# Patient Record
Sex: Male | Born: 1990 | Race: White | Hispanic: No | Marital: Single | Smoking: Former smoker
Health system: Southern US, Community
[De-identification: ages and names within clinical notes are randomized; demographics above are authoritative.]

## PROBLEM LIST (undated history)

## (undated) DIAGNOSIS — S060X9A Concussion with loss of consciousness of unspecified duration, initial encounter: Secondary | ICD-10-CM

## (undated) DIAGNOSIS — F909 Attention-deficit hyperactivity disorder, unspecified type: Secondary | ICD-10-CM

## (undated) HISTORY — DX: Concussion with loss of consciousness of unspecified duration, initial encounter: S06.0X9A

---
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
## 2012-03-08 ENCOUNTER — Other Ambulatory Visit: Payer: Self-pay | Admitting: Internal Medicine

## 2012-03-08 MED ORDER — AMPHETAMINE-DEXTROAMPHET ER 20 MG PO CP24: 20.0000 mg | ORAL_CAPSULE | Freq: Every day | ORAL | Status: DC | PRN

## 2012-03-08 NOTE — Telephone Encounter (Signed)
I spoke with patient's mother and she states Dr.Hopper rx'ed something (? ritlan and patient did not do well)  and then he seen a specialist( patient was given adderall- which he took infrequently). Patient will start school in 3 weeks and patient's mother states he will need something and she does not know which route to go.  Dr.Hopper please advise

## 2012-03-08 NOTE — Telephone Encounter (Signed)
Pt called stated he needs a refill of adderall, I do not see on current or past meds list Cb# [id number]

## 2012-03-08 NOTE — Telephone Encounter (Signed)
Spoke with patient's mother. RX to be picked up Monday

## 2012-03-08 NOTE — Telephone Encounter (Signed)
Adderall XR 20 mg dispense 30. 1 qd prn . Generic formulation if available

## 2012-03-27 ENCOUNTER — Telehealth: Payer: Self-pay | Admitting: *Deleted

## 2012-03-27 NOTE — Telephone Encounter (Signed)
Pt mom return call stating that Per pharmacy med needs PA . Pt mom states that  She will contact pharmacy to have them send over Info, will await fax from pharmacy with PA info.

## 2012-03-27 NOTE — Telephone Encounter (Signed)
Spoke with Pt mom will have Pt call to discuss med issue.

## 2012-03-27 NOTE — Telephone Encounter (Signed)
Pt mom left VM that Pt is having a issue filling the adderall. Pt mom indicated that pharmacy states that have faxed [place] 3 times already and have not gotten any response. Pt mom would like a call back to discuss. Pt mom is not on DPR left message for Pt to return call on home phone and VM on mom phone to return call to office to advise her that she is not on DPR so Pt will need to give permission to discuss med with her.

## 2012-03-27 NOTE — Telephone Encounter (Signed)
Pt return call,Left message to call office.  

## 2012-03-27 NOTE — Telephone Encounter (Signed)
Pt return call stating that he needed a refill on adderall advise Pt that his mom just call for Rx on 03-08-12 so he will need to check with her to verify that Rx was pick up.

## 2012-03-28 NOTE — Telephone Encounter (Signed)
Prior Auth approved 02-27-12 until 03-28-12, pharmacy notified, approval letter scan to chart

## 2012-04-22 ENCOUNTER — Other Ambulatory Visit: Payer: Self-pay | Admitting: *Deleted

## 2012-04-22 MED ORDER — AMPHETAMINE-DEXTROAMPHET ER 20 MG PO CP24: 20.0000 mg | ORAL_CAPSULE | Freq: Every day | ORAL | Status: DC | PRN

## 2012-04-22 NOTE — Telephone Encounter (Signed)
Left message to call office on Pt mom phone.

## 2012-04-23 ENCOUNTER — Telehealth: Payer: Self-pay | Admitting: Internal Medicine

## 2012-04-23 NOTE — Telephone Encounter (Signed)
Pt mom aware Rx ready for pick up. 

## 2012-04-23 NOTE — Telephone Encounter (Signed)
Opened in error

## 2012-05-27 ENCOUNTER — Other Ambulatory Visit: Payer: Self-pay

## 2012-05-27 MED ORDER — AMPHETAMINE-DEXTROAMPHET ER 20 MG PO CP24: 20.0000 mg | ORAL_CAPSULE | Freq: Every day | ORAL | Status: DC | PRN

## 2012-05-27 NOTE — Telephone Encounter (Signed)
OK X1 

## 2012-05-27 NOTE — Telephone Encounter (Signed)
Left message advising pt Rx available for pick up in our office before 5pm.    MW

## 2012-05-27 NOTE — Telephone Encounter (Signed)
Last OV 12/04/11. Last filled 04/22/12 #30 No refill. Plz advise

## 2012-05-27 NOTE — Telephone Encounter (Signed)
MOM LM ON TRIAGE line 1230pm wants to confirm message for adderall has been recv and when med refill will be ready for pick cb# [phone]

## 2012-06-28 ENCOUNTER — Other Ambulatory Visit: Payer: Self-pay

## 2012-06-28 MED ORDER — AMPHETAMINE-DEXTROAMPHET ER 20 MG PO CP24: 20.0000 mg | ORAL_CAPSULE | Freq: Every day | ORAL | Status: DC | PRN

## 2012-06-28 NOTE — Telephone Encounter (Signed)
OK #30 

## 2012-06-28 NOTE — Telephone Encounter (Signed)
Called LMOVM on house phone advising Rx ready for pick up.    MW

## 2012-06-28 NOTE — Telephone Encounter (Signed)
Henry Maldonado called requesting Rx for pt OV 12/04/11 last filled 05/27/12 # 30 no refills  Plz advise    MW

## 2012-07-08 ENCOUNTER — Ambulatory Visit (INDEPENDENT_AMBULATORY_CARE_PROVIDER_SITE_OTHER): Payer: 59 | Admitting: Family

## 2012-07-08 ENCOUNTER — Encounter: Payer: Self-pay | Admitting: Family

## 2012-07-08 VITALS — BP 140/100 | HR 75 | Temp 97.5°F | Resp 16 | Wt 159.1 lb

## 2012-07-08 DIAGNOSIS — N390 Urinary tract infection, site not specified: Secondary | ICD-10-CM | POA: Insufficient documentation

## 2012-07-08 DIAGNOSIS — R300 Dysuria: Secondary | ICD-10-CM

## 2012-07-08 LAB — POCT URINALYSIS DIPSTICK
Glucose, UA: NEGATIVE
Nitrite, UA: NEGATIVE
Urobilinogen, UA: 0.2

## 2012-07-08 MED ORDER — CIPROFLOXACIN HCL 500 MG PO TABS: 500.0000 mg | ORAL_TABLET | Freq: Two times a day (BID) | ORAL | Status: DC

## 2012-07-08 NOTE — Assessment & Plan Note (Signed)
Urine + leuks.  Plan rx with cipro.  Will send urine for culture, as well as GC/Chlamydia.
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
## 2012-07-08 NOTE — Patient Instructions (Signed)
Please call if symptom worsen or if no improvement in 2-3 days.

## 2012-07-09 LAB — GC/CHLAMYDIA PROBE AMP, URINE: Chlamydia, Swab/Urine, PCR: NEGATIVE

## 2012-07-09 LAB — URINE CULTURE
Colony Count: NO GROWTH
Organism ID, Bacteria: NO GROWTH

## 2012-07-10 ENCOUNTER — Telehealth: Payer: Self-pay | Admitting: Family

## 2012-07-10 NOTE — Telephone Encounter (Signed)
I would recommend that he purchase otc lotrimin cream and apply twice daily to affected area to see if this helps.

## 2012-07-10 NOTE — Telephone Encounter (Signed)
Reviewed lab work, left message on cell requesting return call.  When pt calls back please let him know that gonorrhea/chlamydia screen is negative.  Urine culture is negative.  OK to stop antibiotics if feeling better.

## 2012-07-10 NOTE — Telephone Encounter (Signed)
Notified pt and he voices understanding. He states that his symptoms are some improved but still has some itching.

## 2012-07-10 NOTE — Telephone Encounter (Signed)
Notified pt. 

## 2012-07-30 ENCOUNTER — Encounter: Payer: Self-pay | Admitting: Internal Medicine

## 2012-07-30 ENCOUNTER — Ambulatory Visit (INDEPENDENT_AMBULATORY_CARE_PROVIDER_SITE_OTHER): Payer: 59 | Admitting: Internal Medicine

## 2012-07-30 VITALS — BP 108/76 | HR 77 | Resp 12 | Ht 68.0 in | Wt 160.4 lb

## 2012-07-30 DIAGNOSIS — F909 Attention-deficit hyperactivity disorder, unspecified type: Secondary | ICD-10-CM

## 2012-07-30 MED ORDER — AMPHETAMINE-DEXTROAMPHETAMINE 10 MG PO TABS: ORAL_TABLET | ORAL | Status: DC

## 2012-07-30 MED ORDER — AMPHETAMINE-DEXTROAMPHET ER 20 MG PO CP24: 20.0000 mg | ORAL_CAPSULE | Freq: Every day | ORAL | Status: DC | PRN

## 2012-07-30 NOTE — Progress Notes (Signed)
  Subjective:    Patient ID: CALDER OBLINGER, male    DOB: 1991/05/10, 21 y.o.   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 With the medication his grades have been A's and B's in General Studies. It does seem to increase his anxiety some but otherwise there've been no adverse effects.  He was taking 2 pills a day if he had 4 classes per day.   Review of Systems  Constitutional: No significant change in weight,  sleep disruption, fatigue, weakness Cardiovascular: No palpitations, racing, irregularity, sweating Gastrointestinal: No nausea or vomiting,constipation, diarrhea Neuro: No tremor, mental status change,headache  Psych: No  depression, loss of interest, panic attacks,alcohol or drug abuse Endocrine: No change in skin/hair/nails      Objective:   Physical Exam   Gen.:  well-nourished; in no acute distress Eyes: Extraocular motion intact; no lid lag or proptosis Thyroid: normal w/o nodules Heart: Normal rhythm and rate without significant murmur, gallop, or extra heart sounds Lungs: Chest clear to auscultation without rales,rales, wheezes Neuro:Deep tendon reflexes are equal and within normal limits; fine tremor . Hyperactivity of legs Skin: Warm and dry without significant lesions or rashes; no onycholysis Psych: Normally communicative and interactive; no abnormal mood or affect clinically.         Assessment & Plan:

## 2012-07-30 NOTE — Assessment & Plan Note (Addendum)
The Adderall has been a valuable tool as documented by his success at [employer]. Minimal adverse effects such as some increase in anxiety.  Medication refilled ; short acting Adderall mid day if needed

## 2012-07-30 NOTE — Patient Instructions (Addendum)
Review and correct the record as indicated. Please share record with all medical staff seen.  

## 2012-07-31 ENCOUNTER — Ambulatory Visit: Payer: 59 | Admitting: Internal Medicine

## 2012-08-09 ENCOUNTER — Telehealth: Payer: Self-pay | Admitting: Internal Medicine

## 2012-08-09 NOTE — Telephone Encounter (Signed)
Opened in error. BC °

## 2012-08-30 ENCOUNTER — Other Ambulatory Visit: Payer: Self-pay

## 2012-08-30 DIAGNOSIS — F909 Attention-deficit hyperactivity disorder, unspecified type: Secondary | ICD-10-CM

## 2012-08-30 MED ORDER — AMPHETAMINE-DEXTROAMPHETAMINE 10 MG PO TABS: ORAL_TABLET | ORAL | Status: DC

## 2012-08-30 MED ORDER — AMPHETAMINE-DEXTROAMPHET ER 20 MG PO CP24: 20.0000 mg | ORAL_CAPSULE | Freq: Every day | ORAL | Status: DC | PRN

## 2012-08-30 NOTE — Telephone Encounter (Signed)
Spoke with patients mother and informed her rx's will be available for pick-up on Monday, patient will have to sign controlled substance contract. Patient's mother verbalized understanding.

## 2012-10-07 ENCOUNTER — Other Ambulatory Visit: Payer: Self-pay | Admitting: *Deleted

## 2012-10-07 DIAGNOSIS — F909 Attention-deficit hyperactivity disorder, unspecified type: Secondary | ICD-10-CM

## 2012-10-07 MED ORDER — AMPHETAMINE-DEXTROAMPHET ER 20 MG PO CP24: 20.0000 mg | ORAL_CAPSULE | Freq: Every day | ORAL | Status: DC | PRN

## 2012-10-07 MED ORDER — AMPHETAMINE-DEXTROAMPHETAMINE 10 MG PO TABS: ORAL_TABLET | ORAL | Status: DC

## 2012-10-07 NOTE — Telephone Encounter (Signed)
Pt mom aware Rx ready for pick up.

## 2012-11-12 ENCOUNTER — Other Ambulatory Visit: Payer: Self-pay | Admitting: *Deleted

## 2012-11-12 DIAGNOSIS — F909 Attention-deficit hyperactivity disorder, unspecified type: Secondary | ICD-10-CM

## 2012-11-12 MED ORDER — AMPHETAMINE-DEXTROAMPHETAMINE 10 MG PO TABS: ORAL_TABLET | ORAL | Status: DC

## 2012-11-12 MED ORDER — AMPHETAMINE-DEXTROAMPHET ER 20 MG PO CP24: 20.0000 mg | ORAL_CAPSULE | Freq: Every day | ORAL | Status: DC | PRN

## 2012-11-12 NOTE — Telephone Encounter (Signed)
Pt aware Rx will be ready in AM.

## 2012-12-16 ENCOUNTER — Telehealth: Payer: Self-pay | Admitting: *Deleted

## 2012-12-16 DIAGNOSIS — F909 Attention-deficit hyperactivity disorder, unspecified type: Secondary | ICD-10-CM

## 2012-12-16 NOTE — Telephone Encounter (Signed)
Last OV 07-30-12, last filled 11-12-12 #30

## 2012-12-16 NOTE — Telephone Encounter (Signed)
Very good note; well researched & explanatory Refill #30

## 2012-12-17 MED ORDER — AMPHETAMINE-DEXTROAMPHETAMINE 10 MG PO TABS: ORAL_TABLET | ORAL | Status: DC

## 2012-12-17 MED ORDER — AMPHETAMINE-DEXTROAMPHET ER 20 MG PO CP24: 20.0000 mg | ORAL_CAPSULE | Freq: Every day | ORAL | Status: DC | PRN

## 2012-12-17 NOTE — Telephone Encounter (Signed)
Left Pt detail VM Rx ready for pick up.

## 2012-12-17 NOTE — Telephone Encounter (Signed)
Rx placed up front for pick up. JLT

## 2013-01-14 ENCOUNTER — Telehealth: Payer: Self-pay | Admitting: Internal Medicine

## 2013-01-14 ENCOUNTER — Other Ambulatory Visit: Payer: 59

## 2013-01-14 DIAGNOSIS — Z202 Contact with and (suspected) exposure to infections with a predominantly sexual mode of transmission: Secondary | ICD-10-CM

## 2013-01-14 NOTE — Telephone Encounter (Signed)
Pt has already came in today to have labs done.

## 2013-01-14 NOTE — Telephone Encounter (Signed)
Hopp please give order for STD testing, or do you prefer patient schedule appointment with you ?

## 2013-01-14 NOTE — Telephone Encounter (Signed)
Patient called asking to come in for an STD test today. Says that something does not look or feel quite right. Scheduled patient for today at 3:15pm. Please put lab orders in for patient. Thanks.

## 2013-01-14 NOTE — Telephone Encounter (Signed)
HSV 2, VDRL, HIV; Code: STD exposure

## 2013-01-17 ENCOUNTER — Encounter: Payer: Self-pay | Admitting: *Deleted

## 2013-01-21 ENCOUNTER — Ambulatory Visit: Payer: 59 | Admitting: Internal Medicine

## 2013-01-21 VITALS — BP 124/78 | HR 119 | Wt 165.0 lb

## 2013-01-21 DIAGNOSIS — Z202 Contact with and (suspected) exposure to infections with a predominantly sexual mode of transmission: Secondary | ICD-10-CM

## 2013-01-21 DIAGNOSIS — Z9189 Other specified personal risk factors, not elsewhere classified: Secondary | ICD-10-CM

## 2013-01-21 DIAGNOSIS — F4323 Adjustment disorder with mixed anxiety and depressed mood: Secondary | ICD-10-CM

## 2013-01-21 MED ORDER — VILAZODONE HCL 10 & 20 & 40 MG PO KIT: 10.0000 mg | PACK | ORAL | Status: DC

## 2013-01-21 MED ORDER — VILAZODONE HCL 20 MG PO TABS
20.0000 mg | ORAL_TABLET | Freq: Every day | ORAL | Status: DC
Start: 2013-01-21 — End: 2013-01-21

## 2013-01-21 MED ORDER — FAMCICLOVIR 250 MG PO TABS: ORAL_TABLET | ORAL | Status: DC
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
## 2013-01-21 NOTE — Patient Instructions (Addendum)
Safe sex - if you may be exposed to STDs, use a condom. Monthly self genital exam recommended.  Psychiatry referral recommended if anxiety and panic are not controlled with the titration of Viibryd. I expressed that I was concerned about possible OCD or bipolar disorder which would require the expertise of a psychiatrist.

## 2013-01-28 ENCOUNTER — Ambulatory Visit: Payer: 59 | Admitting: Internal Medicine

## 2013-02-07 ENCOUNTER — Telehealth: Payer: Self-pay | Admitting: *Deleted

## 2013-02-07 DIAGNOSIS — F909 Attention-deficit hyperactivity disorder, unspecified type: Secondary | ICD-10-CM

## 2013-02-07 MED ORDER — AMPHETAMINE-DEXTROAMPHETAMINE 10 MG PO TABS: ORAL_TABLET | ORAL | Status: DC

## 2013-02-07 MED ORDER — AMPHETAMINE-DEXTROAMPHET ER 20 MG PO CP24: 20.0000 mg | ORAL_CAPSULE | Freq: Every day | ORAL | Status: DC | PRN

## 2013-02-07 NOTE — Telephone Encounter (Signed)
OK X1 

## 2013-02-07 NOTE — Telephone Encounter (Signed)
.  Last OV 01-21-13, Last refilled 12-17-12 #30

## 2013-02-07 NOTE — Telephone Encounter (Signed)
Refill done.  Pt made aware rx is ready to be picked up.

## 2013-03-06 ENCOUNTER — Encounter: Payer: Self-pay | Admitting: Lab

## 2013-03-07 ENCOUNTER — Ambulatory Visit: Payer: 59 | Admitting: Internal Medicine

## 2013-03-07 ENCOUNTER — Encounter: Payer: Self-pay | Admitting: Internal Medicine

## 2013-03-07 VITALS — BP 124/68 | HR 86 | Temp 97.9°F | Wt 171.0 lb

## 2013-03-07 DIAGNOSIS — L738 Other specified follicular disorders: Secondary | ICD-10-CM

## 2013-03-07 DIAGNOSIS — F909 Attention-deficit hyperactivity disorder, unspecified type: Secondary | ICD-10-CM

## 2013-03-07 DIAGNOSIS — L678 Other hair color and hair shaft abnormalities: Secondary | ICD-10-CM

## 2013-03-07 MED ORDER — AMPHETAMINE-DEXTROAMPHETAMINE 10 MG PO TABS: ORAL_TABLET | ORAL | Status: DC

## 2013-03-07 MED ORDER — HYDROXYZINE HCL 10 MG PO TABS: ORAL_TABLET | ORAL | Status: DC

## 2013-03-07 MED ORDER — AMPHETAMINE-DEXTROAMPHET ER 20 MG PO CP24: 20.0000 mg | ORAL_CAPSULE | Freq: Every day | ORAL | Status: DC | PRN

## 2013-03-07 MED ORDER — DOXYCYCLINE HYCLATE 100 MG PO TABS: ORAL_TABLET | ORAL | Status: DC

## 2013-03-07 NOTE — Progress Notes (Signed)
  Subjective:    Patient ID: Henry Maldonado, male    DOB: 1991-02-27, 22 y.o.   MRN: [id number]  HPI   He noted some penile lesions 3 days ago; these also had some lesions in his suprapubic area & inner thighs which which are pruritic. He's also had small red "marks" on upper extremities. Itching has resulted an eschar these areas. He denies unprotected sex, tick exposure or being in wooded area. No PMH of MRSA.  He took generic Famvir 252 pills twice a day without benefit.    Review of Systems  He denies fever, chills, sweats, weight loss  He has no dysuria, hematuria, or pyuria.  He's had no oral lesions or discharge from his eyes.     Objective:   Physical Exam  He is healthy and well-nourished in no distress  He has no lymphadenopathy about the neck, axilla, or inguinal areas  He has no organomegaly or masses  He has diffuse tiny, papular lesions with eschar mainly over the pubic area, penis,  & inner thighs. He has fewer lesions over the upper abdomen/lower thorax and upper extremities.        Assessment & Plan:  #1 folliculitis type lesions; herpetic infection is not suggested  Plan: See orders and recommendations

## 2013-03-07 NOTE — Patient Instructions (Addendum)
Clean  the affected with Hibiclens daily  as we discussed. If this fails to control the cellulitis; one capful Clorox to be placed in a gallon of water and mixed well. This can be used to cleanse the skin once daily.

## 2013-03-12 ENCOUNTER — Other Ambulatory Visit: Payer: Self-pay | Admitting: *Deleted

## 2013-03-12 DIAGNOSIS — R21 Rash and other nonspecific skin eruption: Secondary | ICD-10-CM

## 2013-03-12 MED ORDER — CHLORHEXIDINE GLUCONATE 4 % EX LIQD: 60.0000 mL | Freq: Every day | CUTANEOUS | Status: DC | PRN

## 2013-03-12 NOTE — Telephone Encounter (Signed)
Rx for hibiclens sent to [facility]

## 2013-03-19 ENCOUNTER — Telehealth: Payer: Self-pay | Admitting: *Deleted

## 2013-03-19 NOTE — Telephone Encounter (Signed)
Spoke with pt who states he was recently treated for small rash with abx and it has now spread to entire body. Patient stated that "it was itching so bad he was about to scratch his f____g skin off. I did ask the pt to refrain from using that type of language and made appt for him to be seen tomorrow as he was in [place] today and could not make it to the office today. Patient did apologize for language and thanked [place] for the prompt return call.

## 2013-03-20 ENCOUNTER — Ambulatory Visit: Payer: 59 | Admitting: Internal Medicine

## 2013-03-25 ENCOUNTER — Encounter: Payer: Self-pay | Admitting: Internal Medicine

## 2013-03-25 ENCOUNTER — Ambulatory Visit: Payer: 59 | Admitting: Internal Medicine

## 2013-03-25 VITALS — BP 122/88 | HR 89 | Temp 97.6°F | Wt 173.0 lb

## 2013-03-25 DIAGNOSIS — D690 Allergic purpura: Secondary | ICD-10-CM

## 2013-03-25 MED ORDER — HYDROXYZINE HCL 10 MG PO TABS: ORAL_TABLET | ORAL | Status: DC

## 2013-03-25 MED ORDER — PREDNISONE 20 MG PO TABS: 20.0000 mg | ORAL_TABLET | Freq: Two times a day (BID) | ORAL | Status: DC

## 2013-03-25 NOTE — Progress Notes (Signed)
  Subjective:    Patient ID: Henry Maldonado, male    DOB: 01-06-1991, 22 y.o.   MRN: [id number]  HPI   The skin lesions for which he was seen 7/25 did not respond to doxycycline. Hydroxyzine at bedtime did help the pruritus. He continues to have pruritic lesions diffusely.  He denies any exposure to vectors, pets, foods, medications, or contact allergens.  He has made major changes in his lifestyle. He plans to move to [place], [place] to attend school and get a job in that area. He has changed some of his social acquaintances  and feels very confident about his future.    Review of Systems  He has no extrinsic symptoms of itchy, watery eyes, sneezing. He's had no swelling of his lips or tongue or other signs of angioedema.  He denies fever, chills, sweats, weight loss.  He's had no oral lesions or blisters  He has no dysuria, hematuria, or pyuria  He denies any arthralgias or uveitis symptoms     Objective:   Physical Exam Gen.: Healthy and well-nourished in appearance. Alert, appropriate and cooperative throughout exam.  Eyes: No corneal or conjunctival inflammation noted. No icterus present  Ears: External  ear exam reveals no significant lesions or deformities. Canals clear .TMs normal. Hearing is grossly normal bilaterally. Nose: External nasal exam reveals no deformity or inflammation. Nasal mucosa are pink and moist. No lesions or exudates noted.  Mouth: Oral mucosa and oropharynx reveal no lesions or exudates. Teeth in good repair. Neck: No deformities, masses, or tenderness noted.  Thyroid normal. Lungs: Normal respiratory effort; chest expands symmetrically. Lungs are clear to auscultation without rales, wheezes, or increased work of breathing. Heart: Normal rate and rhythm. Normal S1 and S2. No gallop, click, or rub. S4 without murmur. Abdomen: Bowel sounds normal; abdomen soft and nontender. No masses, organomegaly or hernias noted. Genitalia: Genitalia  normal .                                Musculoskeletal/extremities: No clubbing, cyanosis, edema, or significant extremity  deformity noted.  Joints normal  Neurologic: Alert and oriented x3.  Skin: He has tiny punctate erythematous lesions which blanch and disappear with pressure. No dermatographia present. Lesions are most prominent over the medial thighs but also scattered over the thorax. Minimal lesions are noted on the posterior thorax. Lymph: No cervical, axillary, or inguinal lymphadenopathy present. Psych: Mood and affect are normal. Normally interactive . Very confident                                                                                    Assessment & Plan:  #1 tiny pruritic, erythematous papules diffusely which blanch with pressure suggesting a vasculitic response to ingested trigger(s).  Plan see orders & recommendations

## 2013-03-25 NOTE — Patient Instructions (Addendum)
Avoid colognes and cosmetics which are not hypoallergenic. Restrict hyperallergenic foods at this time: Nuts, strawberries, seafood , chocolate, and tomatoes.  Please bathe with moisturing liquid soaps, not bar soaps

## 2013-04-06 ENCOUNTER — Other Ambulatory Visit: Payer: Self-pay | Admitting: Internal Medicine

## 2013-04-07 ENCOUNTER — Telehealth: Payer: Self-pay | Admitting: General Practice

## 2013-04-07 DIAGNOSIS — F909 Attention-deficit hyperactivity disorder, unspecified type: Secondary | ICD-10-CM

## 2013-04-07 NOTE — Telephone Encounter (Signed)
Message left on triage line: Pt needs refills on both of his adderal medications. Pt states that he has recently moved to [place] for college. Wanted to have this [facility] [place]. [facility] informing pt that we cannot send the Rx to a pharmacy they must be picked up. However we could fill med for a couple months at a time. Waiting on pt to return call.

## 2013-04-08 ENCOUNTER — Telehealth: Payer: Self-pay | Admitting: General Practice

## 2013-04-09 NOTE — Telephone Encounter (Signed)
Rx sent to the pharmacy by e-script.//AB/CMA 

## 2013-04-10 NOTE — Telephone Encounter (Signed)
Please call mother, Thayer Ohm before 1 at [phone] tomorrow or father, Annette Stable after 1 tomorrow [phone] when prescription is ready to be picked up.  Please do for multiple months if possible.  Thanks

## 2013-04-11 MED ORDER — AMPHETAMINE-DEXTROAMPHETAMINE 10 MG PO TABS: ORAL_TABLET | ORAL | Status: DC

## 2013-04-11 MED ORDER — AMPHETAMINE-DEXTROAMPHET ER 20 MG PO CP24: 20.0000 mg | ORAL_CAPSULE | Freq: Every day | ORAL | Status: DC | PRN

## 2013-04-11 NOTE — Telephone Encounter (Signed)
Med filled. Mom notified.

## 2013-04-11 NOTE — Telephone Encounter (Signed)
Mom notified Rx is ready for pick up.

## 2013-04-11 NOTE — Telephone Encounter (Signed)
Could never get a hold of pt. However, pt mom stopped by office yesterday and stated it would be easiest for her getting to office if we could fill for 2 months at a time.   Last OV 03-25-2013 Both Adderall meds filled on 03-07-2013 #30 with 0 refills.

## 2013-04-11 NOTE — Telephone Encounter (Signed)
--   if he is not up to date on UDS we need to obtain one  -- RF each medicine one months supply -- as far as write Rx for 2 months, will leave that up to the PCP

## 2013-04-17 ENCOUNTER — Telehealth: Payer: Self-pay | Admitting: General Practice

## 2013-04-17 NOTE — Telephone Encounter (Signed)
Pt's mom just called the triage line stating that Pt needs a PA for his Adderall. Family uses [facility]. Pt is away at school for initial contact is mom.

## 2013-04-17 NOTE — Telephone Encounter (Signed)
Prior Berkley Harvey has been approved for patient Adderall medication.  A faxed copy will be scanned into epic patient will be notified.  Ag cma

## 2013-05-15 ENCOUNTER — Telehealth: Payer: Self-pay | Admitting: *Deleted

## 2013-05-15 DIAGNOSIS — F909 Attention-deficit hyperactivity disorder, unspecified type: Secondary | ICD-10-CM

## 2013-05-15 MED ORDER — AMPHETAMINE-DEXTROAMPHET ER 20 MG PO CP24: 20.0000 mg | ORAL_CAPSULE | Freq: Every day | ORAL | Status: DC | PRN

## 2013-05-15 NOTE — Telephone Encounter (Signed)
OK X1 

## 2013-05-15 NOTE — Telephone Encounter (Signed)
Med filled.  

## 2013-05-15 NOTE — Telephone Encounter (Signed)
Pt requesting refill on Adderall. Last seen on 03/25/2013, last filled on 04/11/2013 #30, 0 refills, no UDS on file, contract signed. Please advise. SW, CMA

## 2013-05-19 ENCOUNTER — Telehealth: Payer: Self-pay | Admitting: *Deleted

## 2013-05-19 DIAGNOSIS — F909 Attention-deficit hyperactivity disorder, unspecified type: Secondary | ICD-10-CM

## 2013-05-19 MED ORDER — AMPHETAMINE-DEXTROAMPHET ER 20 MG PO CP24: 20.0000 mg | ORAL_CAPSULE | Freq: Every day | ORAL | Status: DC | PRN

## 2013-05-19 NOTE — Telephone Encounter (Signed)
Pt is on Adderall 10mg   (Mid day prn only) and Adderall XR 20 (1/day)  04/11/2013 prescriptions done for 3 months for the 10mg   05/15/2013-prescription printed for 1 month for 20mg .  Can we do 20mg  for the month of November? Then patient will be due for 10mg  & 20mg  for December. Please advise. SW

## 2013-05-19 NOTE — Telephone Encounter (Signed)
Rx filled. Instructed not to fill until June 19, 2013. Pt the will be due for Adderall 10mg  & 20mg  for December per patient request. SW, CMA

## 2013-05-19 NOTE — Telephone Encounter (Signed)
Yes , post dated Rxs for both OK X 3 mos total

## 2013-06-26 ENCOUNTER — Telehealth: Payer: Self-pay | Admitting: *Deleted

## 2013-06-26 NOTE — Telephone Encounter (Addendum)
Patient is requesting refill for Adderall 10mg    Last visit on 03/25/2013  Last filled on 05/19/2013  UDS-not on file, contract on file.   Please advise. SW

## 2013-06-26 NOTE — Telephone Encounter (Signed)
Ok Rf #30 of each Needs contract and UDS at time of Rx pick up

## 2013-06-27 ENCOUNTER — Other Ambulatory Visit: Payer: Self-pay | Admitting: *Deleted

## 2013-06-27 DIAGNOSIS — F909 Attention-deficit hyperactivity disorder, unspecified type: Secondary | ICD-10-CM

## 2013-06-27 MED ORDER — AMPHETAMINE-DEXTROAMPHETAMINE 10 MG PO TABS: ORAL_TABLET | ORAL | Status: DC

## 2013-06-27 NOTE — Telephone Encounter (Signed)
Called and left message for patient that adderall script is ready for pick up and a urine sample is required as well.

## 2013-06-27 NOTE — Telephone Encounter (Signed)
Done. Pt aware 

## 2013-06-27 NOTE — Telephone Encounter (Signed)
Adderall 10 mg refilled 

## 2013-06-30 ENCOUNTER — Telehealth: Payer: Self-pay | Admitting: *Deleted

## 2013-06-30 NOTE — Telephone Encounter (Signed)
Refill his ADD med ; but if living in [place] he should be seeing MD there for additional refills . No UDS needed for this refill

## 2013-06-30 NOTE — Telephone Encounter (Signed)
Patient called and stated that he very upset that he has to come in and take a UDS when he just took one awhile. Looking further in the chart their is no record of it been done. Patient states that he lives in [place] and it is already bad enough that he has to drive here to pick up his prescription. Patient stated that we need to get it together and call him back. Left message on answering machine to return call to office.

## 2013-07-01 ENCOUNTER — Other Ambulatory Visit: Payer: Self-pay | Admitting: *Deleted

## 2013-07-01 NOTE — Telephone Encounter (Signed)
I called and left message for patient on 06/26/13 informing him that his adderall script is ready for pickup.

## 2013-08-20 ENCOUNTER — Telehealth: Payer: Self-pay | Admitting: *Deleted

## 2013-08-20 DIAGNOSIS — F909 Attention-deficit hyperactivity disorder, unspecified type: Secondary | ICD-10-CM

## 2013-08-20 MED ORDER — AMPHETAMINE-DEXTROAMPHETAMINE 10 MG PO TABS: ORAL_TABLET | ORAL | Status: DC

## 2013-08-20 MED ORDER — AMPHETAMINE-DEXTROAMPHET ER 20 MG PO CP24: 20.0000 mg | ORAL_CAPSULE | Freq: Every day | ORAL | Status: DC | PRN

## 2013-08-20 NOTE — Addendum Note (Signed)
Addended by: Baldwin JamaicaJOHNSON, Jessup Ogas G on: 08/20/2013 04:28 PM   Modules accepted: Orders

## 2013-08-20 NOTE — Telephone Encounter (Signed)
OK for both  I hope he is doing well. If he will be living  in [place] ; he needs to establish with PCP there as per [facility] requirements as monitor needed @ least every 6 mos

## 2013-08-20 NOTE — Telephone Encounter (Signed)
Patient is requesting a refill on Adderall 10mg  and 20mg . Last office visit 03/25/13 Last filled 06/19/13 (20mg ), 06/27/13 (10mg ) Agreement on file, low risk Okay to refill?

## 2013-08-20 NOTE — Telephone Encounter (Signed)
Rx (x2) for adderall printed and placed on ledge for signature.

## 2013-08-21 NOTE — Telephone Encounter (Signed)
LM @ (8:50am) informing the pt that his rx for Adderall is ready for pick-up and it will be put up front.//AB/CMA
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
## 2013-10-10 ENCOUNTER — Telehealth: Payer: Self-pay | Admitting: *Deleted

## 2013-10-10 NOTE — Telephone Encounter (Signed)
Patient left message on triage line requesting refill on Adderall. Attempted to call pt back left message on voice mail of cell phone that pt needs an office visit in order to obtain a refill. Advised in voice mail that Dr[facility], provided pt with office address and phone number so he may schedule appt.

## 2013-10-21 ENCOUNTER — Ambulatory Visit (INDEPENDENT_AMBULATORY_CARE_PROVIDER_SITE_OTHER): Payer: 59 | Admitting: Internal Medicine

## 2013-10-21 ENCOUNTER — Encounter: Payer: Self-pay | Admitting: Internal Medicine

## 2013-10-21 VITALS — BP 100/60 | HR 64 | Temp 98.0°F | Wt 171.0 lb

## 2013-10-21 DIAGNOSIS — B86 Scabies: Secondary | ICD-10-CM

## 2013-10-21 DIAGNOSIS — F909 Attention-deficit hyperactivity disorder, unspecified type: Secondary | ICD-10-CM

## 2013-10-21 MED ORDER — AMPHETAMINE-DEXTROAMPHET ER 20 MG PO CP24: 20.0000 mg | ORAL_CAPSULE | Freq: Every day | ORAL | Status: DC | PRN

## 2013-10-21 MED ORDER — PERMETHRIN 5 % EX CREA: 1.0000 "application " | TOPICAL_CREAM | Freq: Once | CUTANEOUS | Status: DC

## 2013-10-21 MED ORDER — AMPHETAMINE-DEXTROAMPHETAMINE 10 MG PO TABS: ORAL_TABLET | ORAL | Status: DC

## 2013-10-21 NOTE — Progress Notes (Signed)
   Subjective:    Patient ID: Henry Maldonado, male    DOB: 04-Dec-1990, 22 y.o.   MRN: [id number]  HPI   His girl friend was diagnosed by her physician as having scabies; he had similar physical signs of dermatologic involvement. She was prescribed a full course of generic Elimite; her doctor gave him a partial prescription & recommend followup here.  Apparently the vector originated in a bed belonging to a friend  in which they slept..  He has improved with the partial treatment but continues to have some itching of the anterior thighs.      Review of Systems  He has not had to use the ADD agents on a regular basis as he is now in Arts development officercomputer science  online program. This is an area in which he is very skilled & which holds his interested.  He has moved back from [place] to [place].     Objective:   Physical Exam General appearance is one of good health and nourishment w/o distress.  Eyes: No conjunctival inflammation or scleral icterus is present.  Heart:  Normal rate and regular rhythm. S1 and S2 normal without gallop, murmur, click, rub or other extra sounds     Lungs:Chest clear to auscultation; no wheezes, rhonchi,rales ,or rubs present.No increased work of breathing.   Skin:Warm & dry.  Intact without suspicious lesions or rashes ; no jaundice . No intertriginous or upper back lesions. Multiple tattoos.  Lymphatic: No lymphadenopathy is noted about the head, neck, axilla.   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

## 2013-10-22 NOTE — Patient Instructions (Signed)
Employ the generic Elimite as instructed. Laundry hygiene as discussed.

## 2013-11-20 ENCOUNTER — Telehealth: Payer: Self-pay | Admitting: Internal Medicine

## 2013-11-20 DIAGNOSIS — F909 Attention-deficit hyperactivity disorder, unspecified type: Secondary | ICD-10-CM

## 2013-11-20 NOTE — Telephone Encounter (Signed)
Requesting Adderall 20mg -Take 1 tablet by mouth daily as needed.and 10mg -Take 1 tablet by mouth mid day as needed only. Last refill:10-21-13:#30,0 Last OV:10-21-13 UDS:03-19-13 Please advise.//AB/CMA

## 2013-11-20 NOTE — Telephone Encounter (Signed)
Patient is requesting a refill on Adderall 10mg  and 20mg .  Please call when ready for pick-up.

## 2013-11-20 NOTE — Telephone Encounter (Signed)
OK 

## 2013-11-21 MED ORDER — AMPHETAMINE-DEXTROAMPHET ER 20 MG PO CP24: 20.0000 mg | ORAL_CAPSULE | Freq: Every day | ORAL | Status: DC | PRN

## 2013-11-21 MED ORDER — AMPHETAMINE-DEXTROAMPHETAMINE 10 MG PO TABS: ORAL_TABLET | ORAL | Status: DC

## 2013-11-21 NOTE — Telephone Encounter (Signed)
Rx printed and placed up front for the pt to pick-up.  LMOM(cell) @ (8:20am) informing the pt that his rx is ready and it will be placed up front for him to pick-up.//AB/CMA

## 2014-01-02 ENCOUNTER — Other Ambulatory Visit: Payer: Self-pay | Admitting: Internal Medicine

## 2014-01-02 ENCOUNTER — Telehealth: Payer: Self-pay | Admitting: *Deleted

## 2014-01-02 DIAGNOSIS — F909 Attention-deficit hyperactivity disorder, unspecified type: Secondary | ICD-10-CM

## 2014-01-02 MED ORDER — AMPHETAMINE-DEXTROAMPHETAMINE 10 MG PO TABS: ORAL_TABLET | ORAL | Status: DC

## 2014-01-02 MED ORDER — AMPHETAMINE-DEXTROAMPHET ER 20 MG PO CP24: 20.0000 mg | ORAL_CAPSULE | Freq: Every day | ORAL | Status: DC | PRN

## 2014-01-02 NOTE — Telephone Encounter (Signed)
Pt called requesting both dosages of Adderal to be refilled.  Please advise

## 2014-01-06 NOTE — Telephone Encounter (Signed)
Left message on VM Rx ready for pick up 

## 2014-01-29 ENCOUNTER — Other Ambulatory Visit: Payer: Self-pay | Admitting: Internal Medicine

## 2014-01-29 DIAGNOSIS — F909 Attention-deficit hyperactivity disorder, unspecified type: Secondary | ICD-10-CM

## 2014-01-29 MED ORDER — AMPHETAMINE-DEXTROAMPHETAMINE 10 MG PO TABS: ORAL_TABLET | ORAL | Status: DC

## 2014-01-29 MED ORDER — AMPHETAMINE-DEXTROAMPHET ER 20 MG PO CP24: 20.0000 mg | ORAL_CAPSULE | Freq: Every day | ORAL | Status: DC | PRN

## 2014-01-29 NOTE — Telephone Encounter (Signed)
Done hardcopy to robin  

## 2014-01-29 NOTE — Telephone Encounter (Signed)
Patient left message that he needs his Adderall refilled.

## 2014-01-29 NOTE — Telephone Encounter (Signed)
Called the patient informed Dr. Alwyn RenHopper is out of the office today and that Dr. Jonny RuizJohn did do his adderall refills, and hardcopy's are ready for pickup at the front desk.

## 2014-03-03 ENCOUNTER — Telehealth: Payer: Self-pay

## 2014-03-03 DIAGNOSIS — F909 Attention-deficit hyperactivity disorder, unspecified type: Secondary | ICD-10-CM

## 2014-03-03 NOTE — Telephone Encounter (Signed)
OK 1 month for both

## 2014-03-03 NOTE — Telephone Encounter (Signed)
Patient called [facility] requesting to pick up refill for adderall 10 and 20 mg on Friday. Thanks

## 2014-03-04 MED ORDER — AMPHETAMINE-DEXTROAMPHETAMINE 10 MG PO TABS: ORAL_TABLET | ORAL | Status: DC

## 2014-03-04 MED ORDER — AMPHETAMINE-DEXTROAMPHET ER 20 MG PO CP24: 20.0000 mg | ORAL_CAPSULE | Freq: Every day | ORAL | Status: DC | PRN

## 2014-03-04 NOTE — Telephone Encounter (Signed)
RX signed and placed upfront

## 2014-04-06 ENCOUNTER — Other Ambulatory Visit: Payer: Self-pay

## 2014-04-06 NOTE — Telephone Encounter (Signed)
Pt calling requesting refills on both adderall prescriptions, attempted to return call, but was unable to leave a message

## 2014-04-10 ENCOUNTER — Ambulatory Visit (INDEPENDENT_AMBULATORY_CARE_PROVIDER_SITE_OTHER): Payer: BLUE CROSS/BLUE SHIELD | Admitting: Internal Medicine

## 2014-04-10 ENCOUNTER — Encounter: Payer: Self-pay | Admitting: Internal Medicine

## 2014-04-10 VITALS — BP 112/82 | HR 67 | Temp 98.4°F | Wt 175.2 lb

## 2014-04-10 DIAGNOSIS — F909 Attention-deficit hyperactivity disorder, unspecified type: Secondary | ICD-10-CM

## 2014-04-10 MED ORDER — AMPHETAMINE-DEXTROAMPHETAMINE 10 MG PO TABS: ORAL_TABLET | ORAL | Status: DC

## 2014-04-10 MED ORDER — AMPHETAMINE-DEXTROAMPHET ER 20 MG PO CP24: 20.0000 mg | ORAL_CAPSULE | Freq: Every day | ORAL | Status: DC | PRN

## 2014-04-10 NOTE — Assessment & Plan Note (Signed)
meds renewed

## 2014-04-10 NOTE — Patient Instructions (Signed)
The ADD/ADHD medication can be written for one month with 2 postdated prescriptions.  Report any of the following: Weight gain / loss ,  sleep disruption, fatigue, weakness Heart racing or skipping, abnormal sweating Nausea or vomiting,loss of appetite,constipation, diarrhea Tremor, mental status change (hyperactive or lethargy),headache Anxiety, depression, loss of interest, panic attacks Change in skin/hair/nails

## 2014-04-10 NOTE — Progress Notes (Signed)
   Subjective:    Patient ID: Henry Maldonado, male    DOB: Aug 15, 1990, 23 y.o.   MRN: [id number]  HPI   He's been taking his Adderall extended release as well as a short acting form later  In the day as needed throughout the work week. Additionally he is on call every other weekend for emergencies and has taken meds on weekends for those reasons.  It is very effective with no adverse effects.  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      Review of Systems  Negative symptoms & signs: No weight gain / loss ,  sleep disruption, fatigue, weakness No heart racing or skipping, abnormal sweating No nausea or vomiting,loss of appetite,constipation, diarrhea No tremor, mental status change (hyperactive or lethargy),headache  No anxiety, depression, loss of interest, panic attacks, excess alcohol use No change in skin/hair/nails       Objective:   Physical Exam   Positive or pertinent findings include: Head shaven. He has well-healed scars of the forehead. There is dullness to percussion upper quadrant without organomegaly or masses.   Gen.:  well-nourished; in no acute distress Eyes: Extraocular motion intact; no lid lag or proptosis ,minimal unsustained vertical nystagmus Neck: full ROM; no masses ; thyroid normal  Heart: Normal rhythm and rate without significant murmur, gallop, or extra heart sounds Lungs: Chest clear to auscultation without rales,rales, wheezes Neuro:Deep tendon reflexes are equal and within normal limits; no tremor  Skin: Warm and dry without significant lesions or rashes; no onycholysis Lymphatic: no cervical or axillary LA Psych: Normally communicative and interactive; no abnormal mood or affect clinically.           Assessment & Plan:  See Current Assessment & Plan in Problem List under specific Diagnosis

## 2014-04-10 NOTE — Progress Notes (Signed)
Pre visit review using our clinic review tool, if applicable. No additional management support is needed unless otherwise documented below in the visit note. 

## 2014-07-01 ENCOUNTER — Ambulatory Visit: Payer: BC Managed Care – PPO | Admitting: Internal Medicine

## 2014-07-03 ENCOUNTER — Ambulatory Visit: Payer: BC Managed Care – PPO | Admitting: Internal Medicine

## 2014-07-06 ENCOUNTER — Ambulatory Visit: Payer: BC Managed Care – PPO | Admitting: Internal Medicine

## 2014-07-20 ENCOUNTER — Other Ambulatory Visit (INDEPENDENT_AMBULATORY_CARE_PROVIDER_SITE_OTHER): Payer: BLUE CROSS/BLUE SHIELD

## 2014-07-20 ENCOUNTER — Encounter: Payer: Self-pay | Admitting: Internal Medicine

## 2014-07-20 ENCOUNTER — Ambulatory Visit (INDEPENDENT_AMBULATORY_CARE_PROVIDER_SITE_OTHER): Payer: BLUE CROSS/BLUE SHIELD | Admitting: Internal Medicine

## 2014-07-20 VITALS — BP 122/78 | HR 124 | Temp 98.4°F | Wt 184.1 lb

## 2014-07-20 DIAGNOSIS — R000 Tachycardia, unspecified: Secondary | ICD-10-CM

## 2014-07-20 DIAGNOSIS — F908 Attention-deficit hyperactivity disorder, other type: Secondary | ICD-10-CM

## 2014-07-20 LAB — TSH: TSH: 2.19 u[IU]/mL (ref 0.35–4.50)

## 2014-07-20 LAB — T4, FREE: Free T4: 0.86 ng/dL (ref 0.60–1.60)

## 2014-07-20 LAB — T3, FREE: T3 FREE: 3.8 pg/mL (ref 2.3–4.2)

## 2014-07-20 MED ORDER — AMPHETAMINE-DEXTROAMPHETAMINE 10 MG PO TABS: ORAL_TABLET | ORAL | Status: DC

## 2014-07-20 MED ORDER — AMPHETAMINE-DEXTROAMPHET ER 20 MG PO CP24: 20.0000 mg | ORAL_CAPSULE | Freq: Every day | ORAL | Status: DC | PRN

## 2014-07-20 NOTE — Progress Notes (Signed)
   Subjective:    Patient ID: Henry Maldonado, male    DOB: [birth year], 23 y.o.   MRN: [id number]  HPI  He is here to follow-up his ADD; he takes Adderall extended release 20 mg 4-5 days week on average. He also takes the 10 mg 4- 5 days a week on average after lunch.  He uses these when he has a long work day.  He finds it very effective. He states these no longer fidgety and is able to focus on one task  He has no adverse effects from the medication  He has 1 cup of coffee a day but denies other stimulants. He does not smoke  His last thyroid function tests were in 2011; TSH was 2.5 and free T4 0.8.   Review of Systems   He specifically denies fever chills sweats, unexplained weight loss  He denies blurred vision, double vision, loss of vision  He has no chest pain, palpitations, dyspnea  He denies any adverse effect on sleep or appetite  He has no constipation or diarrhea  There is no associated anxiety or depression  He also denies any tremor. He denies any melena or rectal bleeding or other bleeding dyscrasias.     Objective:   Physical Exam  Initial pulse was 124; recheck was 102. Exam was otherwise unremarkable.  Gen.:  well-nourished; in no acute distress Eyes: Extraocular motion intact; no lid lag or proptosis ,nystagmus Neck: full ROM; no masses ; thyroid normal  Heart: Normal rhythm without significant murmur, gallop, or extra heart sounds Lungs: Chest clear to auscultation without rales,rales, wheezes Neuro:Deep tendon reflexes are equal and within normal limits; no tremor  Skin: Warm and dry without significant lesions or rashes; no onycholysis Lymphatic: no cervical or axillary LA Psych: Normally communicative and interactive; no abnormal mood or affect clinically. Very animated when discussing his job.         Assessment & Plan:  #1 ADD; effective response to medicines without adverse effects  #2 tachycardia  Plan :see orders &  recommendations

## 2014-07-20 NOTE — Patient Instructions (Signed)
To prevent fast heart beats, avoid stimulants such as decongestants, diet pills, nicotine, or caffeine (coffee, tea, cola, or chocolate) to excess.  Your next office appointment will be determined based upon review of your pending labs . Those instructions will be transmitted to you  by mail Followup as needed for your acute issue. Please report any significant change in your symptoms.

## 2014-07-20 NOTE — Progress Notes (Signed)
Pre visit review using our clinic review tool, if applicable. No additional management support is needed unless otherwise documented below in the visit note. 

## 2014-10-05 ENCOUNTER — Ambulatory Visit (INDEPENDENT_AMBULATORY_CARE_PROVIDER_SITE_OTHER): Payer: BLUE CROSS/BLUE SHIELD | Admitting: Internal Medicine

## 2014-10-05 ENCOUNTER — Encounter: Payer: Self-pay | Admitting: Internal Medicine

## 2014-10-05 VITALS — BP 134/86 | HR 87 | Temp 97.8°F | Ht 68.0 in | Wt 190.2 lb

## 2014-10-05 DIAGNOSIS — F908 Attention-deficit hyperactivity disorder, other type: Secondary | ICD-10-CM

## 2014-10-05 MED ORDER — AMPHETAMINE-DEXTROAMPHETAMINE 10 MG PO TABS: ORAL_TABLET | ORAL | Status: DC

## 2014-10-05 MED ORDER — AMPHETAMINE-DEXTROAMPHET ER 20 MG PO CP24: 20.0000 mg | ORAL_CAPSULE | Freq: Every day | ORAL | Status: DC | PRN

## 2014-10-05 NOTE — Progress Notes (Signed)
Pre visit review using our clinic review tool, if applicable. No additional management support is needed unless otherwise documented below in the visit note. 

## 2014-10-05 NOTE — Progress Notes (Signed)
   Subjective:    Patient ID: Henry Maldonado, male    DOB: July 12, 1991, 24 y.o.   MRN: [id number]  HPI  He has been compliant with his ADD medication without adverse effect.  His work schedule is markedly variable. During long days he will take 20 mg followed by 10 mg later in the day of the Adderall. If he starts work late in the day he will only take the 20 mg pill. He does work on the weekends intermittently.  He will be in [place] for at least 2 months and is requesting a longer refill on the medications.  Review of Systems  Negative symptoms & signs: No weight loss ,  sleep disruption, fatigue, weakness. Weight actually up some because he must eat fast food when out of town. No heart racing or skipping, abnormal sweating No nausea or vomiting,loss of appetite,constipation, diarrhea No tremor, mental status change (hyperactive or lethargy),headache  No anxiety, depression, loss of interest, panic attacks, excess alcohol use No change in skin/hair/nails      Objective:   Physical Exam   Gen.:  Adequately nourished; in no acute distress Eyes: Extraocular motion intact; no lid lag , proptosis , or nystagmus Neck: full ROM; no masses ; thyroid normal  Heart: Normal rhythm and rate without significant murmur, gallop, or extra heart sounds Lungs: Chest clear to auscultation without rales,rales, wheezes Neuro:Deep tendon reflexes are equal and within normal limits; no tremor  Skin/Nails: Warm and dry without significant lesions or rashes; no onycholysis Lymphatic: no cervical or axillary LA Psych: Normally communicative and interactive; no abnormal mood or affect clinically.       Assessment & Plan:  #1 ADD; excellent response to medication without adverse effects new  Plan: 3 months of medication will be prescribed because of the logistics of his work schedule

## 2014-10-06 NOTE — Patient Instructions (Signed)
As we discussed; please safeguard the refill prescriptions.Unfortunately if they are lost; they can not be replaced as this is a controlled substance.This is the [facility] [employer] of Medicine standard.

## 2015-01-19 ENCOUNTER — Other Ambulatory Visit: Payer: Self-pay

## 2015-01-19 ENCOUNTER — Telehealth: Payer: Self-pay | Admitting: Internal Medicine

## 2015-01-19 DIAGNOSIS — F908 Attention-deficit hyperactivity disorder, other type: Secondary | ICD-10-CM

## 2015-01-19 MED ORDER — AMPHETAMINE-DEXTROAMPHET ER 20 MG PO CP24: 20.0000 mg | ORAL_CAPSULE | Freq: Every day | ORAL | Status: DC | PRN

## 2015-01-19 MED ORDER — AMPHETAMINE-DEXTROAMPHETAMINE 10 MG PO TABS
ORAL_TABLET | ORAL | Status: DC
Start: 2015-01-19 — End: 2015-01-26

## 2015-01-19 NOTE — Telephone Encounter (Signed)
Please advise, thanks.

## 2015-01-19 NOTE — Telephone Encounter (Signed)
Left message advising patient that both adderall rx are ready for pick up at office---BUT Patient MUST make office visit appointment before he leaves with these prescriptions

## 2015-01-19 NOTE — Telephone Encounter (Signed)
Pt called in and said that he is traveling a lot and wanted to know if hop can refill both his Addrells.  He said at least a month until he can get in here for a fu.  He said he can stop by an pick it up?

## 2015-01-19 NOTE — Telephone Encounter (Signed)
OK if on schedule

## 2015-01-26 ENCOUNTER — Encounter: Payer: Self-pay | Admitting: Internal Medicine

## 2015-01-26 ENCOUNTER — Ambulatory Visit (INDEPENDENT_AMBULATORY_CARE_PROVIDER_SITE_OTHER): Payer: BLUE CROSS/BLUE SHIELD | Admitting: Internal Medicine

## 2015-01-26 VITALS — BP 120/85 | HR 66 | Temp 97.9°F | Resp 20 | Wt 181.0 lb

## 2015-01-26 DIAGNOSIS — F908 Attention-deficit hyperactivity disorder, other type: Secondary | ICD-10-CM | POA: Diagnosis not present

## 2015-01-26 MED ORDER — AMPHETAMINE-DEXTROAMPHETAMINE 10 MG PO TABS: ORAL_TABLET | ORAL | Status: DC

## 2015-01-26 MED ORDER — AMPHETAMINE-DEXTROAMPHET ER 20 MG PO CP24: 20.0000 mg | ORAL_CAPSULE | Freq: Every day | ORAL | Status: DC | PRN

## 2015-01-26 NOTE — Progress Notes (Signed)
Pre visit review using our clinic review tool, if applicable. No additional management support is needed unless otherwise documented below in the visit note. 

## 2015-01-26 NOTE — Assessment & Plan Note (Signed)
01/26/2015: Excellent response to medication without adverse effects. Medication should be renewed for 6 months without need for follow-up.

## 2015-01-26 NOTE — Patient Instructions (Addendum)
Please report any adverse effects with your medications.  Your blood pressure goal is an average less than 135/85. It can be checked at the pharmacy; please keep a diary of readings.

## 2015-01-26 NOTE — Progress Notes (Signed)
   Subjective:    Patient ID: Henry Maldonado, male    DOB: Jul 05, 1991, 24 y.o.   MRN: [id number]  HPI  His medications for his ADD are very effective with no adverse effects.  His blood pressure initially was 160/90. He has no history of hypertension. He only got 3 hours of sleep in the last 24 hours due to job requirements. He also drank an energy drink to stay awake. This was an aberration; he does not ingest stimulants on regular basis.   Review of Systems  Negative symptoms & signs: No weight gain / loss ,  sleep disruption, fatigue, weakness No heart racing or skipping, abnormal sweating No nausea or vomiting,loss of appetite,constipation, diarrhea No tremor, mental status change (hyperactive or lethargy),headache  No anxiety, depression, loss of interest, panic attacks, excess alcohol use No change in skin/hair/nails       Objective:   Physical Exam  Appears healthy and well-nourished & in no acute distress  No carotid bruits are present.Thyroid normal to palpation  Heart rhythm and rate are normal with no gallop or murmur  Chest is clear with no increased work of breathing  There is no evidence of aortic aneurysm or renal artery bruits  Abdomen soft with no organomegaly or masses. No HJR  No clubbing, cyanosis or edema present.  Pedal pulses are intact   No ischemic skin changes are present . Fingernails/ toenails healthy   Alert and oriented. Strength, tone, DTRs reflexes normal  Skin: scattered tatooes         Assessment & Plan:  See Current Assessment & Plan in Problem List under specific Diagnosis

## 2015-02-16 ENCOUNTER — Telehealth: Payer: Self-pay | Admitting: Internal Medicine

## 2015-02-16 NOTE — Telephone Encounter (Signed)
Patient stated that he need a refill of his Adderall will like to know if he can pick it up this morning. Please advise

## 2015-02-16 NOTE — Telephone Encounter (Signed)
LVM stating refill is ready for pick up at front desk and must schedule an office visit before leaving.

## 2015-02-16 NOTE — Telephone Encounter (Signed)
Patient called regarding his adderall rx. He states the paper rx he picked up today is dated for 01/19/15. He is leaving to go out of town for an extended period of time and would like a call back ASAP. CB# [phone]

## 2015-03-05 ENCOUNTER — Encounter: Payer: Self-pay | Admitting: Internal Medicine

## 2015-03-05 ENCOUNTER — Ambulatory Visit (INDEPENDENT_AMBULATORY_CARE_PROVIDER_SITE_OTHER): Payer: BLUE CROSS/BLUE SHIELD | Admitting: Internal Medicine

## 2015-03-05 VITALS — BP 126/78 | HR 95 | Temp 97.8°F | Resp 16 | Wt 178.0 lb

## 2015-03-05 DIAGNOSIS — F908 Attention-deficit hyperactivity disorder, other type: Secondary | ICD-10-CM

## 2015-03-05 MED ORDER — AMPHETAMINE-DEXTROAMPHET ER 20 MG PO CP24: 20.0000 mg | ORAL_CAPSULE | Freq: Every day | ORAL | Status: DC | PRN

## 2015-03-05 MED ORDER — AMPHETAMINE-DEXTROAMPHETAMINE 10 MG PO TABS
ORAL_TABLET | ORAL | Status: DC
Start: 2015-03-05 — End: 2015-05-31

## 2015-03-05 MED ORDER — AMPHETAMINE-DEXTROAMPHETAMINE 10 MG PO TABS
ORAL_TABLET | ORAL | Status: DC
Start: 2015-03-05 — End: 2015-03-05

## 2015-03-05 MED ORDER — AMPHETAMINE-DEXTROAMPHETAMINE 10 MG PO TABS: ORAL_TABLET | ORAL | Status: DC

## 2015-03-05 NOTE — Progress Notes (Signed)
Pre visit review using our clinic review tool, if applicable. No additional management support is needed unless otherwise documented below in the visit note. 

## 2015-03-05 NOTE — Progress Notes (Signed)
   Subjective:    Patient ID: Henry Maldonado, male    DOB: 11-Nov-1990, 24 y.o.   MRN: [id number]  HPI  He has been compliant with his medications without adverse effects. 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 He is in [facility] at this time ;he returns here for 4-5 day stretches intermittently  He has received good reviews his work.  He is under some stress as he and his girlfriend are having issues he feels he is handling this adequately.   Review of Systems  Negative symptoms & signs: No weight gain / loss ,  sleep disruption, fatigue, weakness No heart racing or skipping, abnormal sweating No nausea or vomiting,loss of appetite,constipation, diarrhea No tremor, mental status change (hyperactive or lethargy),headache  No anxiety, depression, loss of interest, panic attacks, excess alcohol use No change in skin/hair/nails     Objective:   Physical Exam   Gen.:  Adequately nourished; in no acute distress Eyes: Extraocular motion intact; no lid lag , proptosis , or nystagmus Neck: full ROM; no masses ; thyroid normal  Heart: Normal rhythm and rate without significant murmur, gallop, or extra heart sounds Lungs: Chest clear to auscultation without rales,rales, wheezes Neuro:Deep tendon reflexes are equal and within normal limits; no tremor  Skin/Nails: Warm and dry without significant lesions or rashes; no onycholysis Lymphatic: no cervical or axillary LA Psych: Normally communicative and interactive; no abnormal mood or affect clinically.          Assessment & Plan:   #1 ADHD, excellent response to medications  Plan: Medications will be refilled 3 months. He should be reevaluated in 6 months.

## 2015-03-05 NOTE — Patient Instructions (Signed)
Medications will be refilled for 90 days. Unfortunately if the prescriptions or lost, they cannot be replaced.

## 2015-03-08 ENCOUNTER — Ambulatory Visit: Payer: BLUE CROSS/BLUE SHIELD | Admitting: Internal Medicine

## 2015-04-28 ENCOUNTER — Telehealth: Payer: Self-pay | Admitting: Internal Medicine

## 2015-04-28 ENCOUNTER — Encounter: Payer: Self-pay | Admitting: Internal Medicine

## 2015-04-28 NOTE — Telephone Encounter (Signed)
  See letter; he'll need to pick it up tomorrow

## 2015-04-28 NOTE — Telephone Encounter (Signed)
Please advise 

## 2015-04-28 NOTE — Telephone Encounter (Signed)
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     Best number [phone]

## 2015-04-29 NOTE — Telephone Encounter (Signed)
Spoke with pt 04/28/15, stated he would be in on 04/29/15 to pick up letter

## 2015-05-31 ENCOUNTER — Telehealth: Payer: Self-pay | Admitting: *Deleted

## 2015-05-31 DIAGNOSIS — F908 Attention-deficit hyperactivity disorder, other type: Secondary | ICD-10-CM

## 2015-05-31 MED ORDER — AMPHETAMINE-DEXTROAMPHET ER 20 MG PO CP24: 20.0000 mg | ORAL_CAPSULE | Freq: Every day | ORAL | Status: DC | PRN

## 2015-05-31 MED ORDER — AMPHETAMINE-DEXTROAMPHETAMINE 10 MG PO TABS: ORAL_TABLET | ORAL | Status: DC

## 2015-05-31 NOTE — Telephone Encounter (Signed)
Left msg on triage requesting rx for Adderral.../lmb

## 2015-05-31 NOTE — Telephone Encounter (Signed)
OK if not premature; I thought he got 3 post dated Rxs @ last visit

## 2015-05-31 NOTE — Telephone Encounter (Signed)
Correct which he is due for refill on the 06/05/15.PRINTED RX'S NOTIFIED PT READY FOR PICK-UP...Raechel Chute/LMB

## 2015-07-03 ENCOUNTER — Emergency Department (HOSPITAL_COMMUNITY)
Admission: EM | Admit: 2015-07-03 | Discharge: 2015-07-03 | Disposition: A | Discharge status: Home / Self Care | Payer: BLUE CROSS/BLUE SHIELD | Source: Home / Self Care | Attending: Emergency Medicine | Admitting: Emergency Medicine

## 2015-07-03 ENCOUNTER — Encounter (HOSPITAL_COMMUNITY): Payer: Self-pay | Admitting: *Deleted

## 2015-07-03 DIAGNOSIS — R1033 Periumbilical pain: Secondary | ICD-10-CM | POA: Diagnosis not present

## 2015-07-03 NOTE — ED Notes (Signed)
Upon entering room to review discharge pt, pt no longer in room.  D/C instructions had been reviewed w/ pt verbally per D. Mabe, NP.

## 2015-07-03 NOTE — Discharge Instructions (Signed)
Abdominal Pain, Adult He may have had some gastritis as a result of your alcohol intake last night. The location of your pain that occurred this morning is primarily over the middle of the gut/intestines. It is likely a had some cramping of the colon associated with gas. There is also some suspicion of retained stool and therefore he may have some constipation. No heavy meals today. Primarily liquids and no alcohol. Is not having good bowel movements by tomorrow consider using MiraLAX as we discussed. For any worsening, new symptoms or problems, persistent pain, fever, vomiting recommend seeking medical attention promptly such as in the emergency department. Many things can cause abdominal pain. Usually, abdominal pain is not caused by a disease and will improve without treatment. It can often be observed and treated at home. Your health care provider will do a physical exam and possibly order blood tests and X-rays to help determine the seriousness of your pain. However, in many cases, more time must pass before a clear cause of the pain can be found. Before that point, your health care provider may not know if you need more testing or further treatment. HOME CARE INSTRUCTIONS Monitor your abdominal pain for any changes. The following actions may help to alleviate any discomfort you are experiencing:  Only take over-the-counter or prescription medicines as directed by your health care provider.  Do not take laxatives unless directed to do so by your health care provider.  Try a clear liquid diet (broth, tea, or water) as directed by your health care provider. Slowly move to a bland diet as tolerated. SEEK MEDICAL CARE IF:  You have unexplained abdominal pain.  You have abdominal pain associated with nausea or diarrhea.  You have pain when you urinate or have a bowel movement.  You experience abdominal pain that wakes you in the night.  You have abdominal pain that is worsened or improved by  eating food.  You have abdominal pain that is worsened with eating fatty foods.  You have a fever. SEEK IMMEDIATE MEDICAL CARE IF:  Your pain does not go away within 2 hours.  You keep throwing up (vomiting).  Your pain is felt only in portions of the abdomen, such as the right side or the left lower portion of the abdomen.  You pass bloody or black tarry stools. MAKE SURE YOU:  Understand these instructions.  Will watch your condition.  Will get help right away if you are not doing well or get worse.   This information is not intended to replace advice given to you by your health care provider. Make sure you discuss any questions you have with your health care provider.   Document Released: 05/10/2005 Document Revised: 04/21/2015 Document Reviewed: 04/09/2013 Elsevier Interactive Patient Education [employer]2016 [employer] Inc[employer]
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
## 2015-07-03 NOTE — ED Notes (Signed)
C/O intermittent, severe, sharp abd pains in periumbilical and epigastric area.  No pain at present.  States he did drink a lot of alcohol last night.  Has had slight diarrhea and slight nausea, but  Pt feels those sxs are normal based on the amount of alcohol he drank last night.  Has taken Pepto.

## 2015-07-14 ENCOUNTER — Ambulatory Visit (INDEPENDENT_AMBULATORY_CARE_PROVIDER_SITE_OTHER): Payer: BLUE CROSS/BLUE SHIELD | Admitting: Family

## 2015-07-14 ENCOUNTER — Encounter: Payer: Self-pay | Admitting: Family

## 2015-07-14 VITALS — BP 134/88 | HR 81 | Temp 98.0°F | Resp 18 | Ht 68.0 in | Wt 187.0 lb

## 2015-07-14 DIAGNOSIS — F908 Attention-deficit hyperactivity disorder, other type: Secondary | ICD-10-CM

## 2015-07-14 MED ORDER — AMPHETAMINE-DEXTROAMPHET ER 20 MG PO CP24: 20.0000 mg | ORAL_CAPSULE | Freq: Every day | ORAL | Status: DC

## 2015-07-14 MED ORDER — AMPHETAMINE-DEXTROAMPHETAMINE 10 MG PO TABS: 10.0000 mg | ORAL_TABLET | Freq: Every day | ORAL | Status: DC | PRN

## 2015-07-14 NOTE — Patient Instructions (Addendum)
Thank you for choosing [facility].  Summary/Instructions:  Your prescription(s) have been submitted to your pharmacy or been printed and provided for you. Please take as directed and contact our office if you believe you are having problem(s) with the medication(s) or have any questions.  Continue to take your medications as prescribed.  
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
## 2015-07-14 NOTE — Assessment & Plan Note (Signed)
ADHD is well controlled with current regimen of Adderall and denies adverse side effects. Continue current dosage of Adderall. [place] controlled substance database reviewed with no irregularities. Follow-up in 3 months.

## 2015-07-14 NOTE — Progress Notes (Signed)
Pre visit review using our clinic review tool, if applicable. No additional management support is needed unless otherwise documented below in the visit note. 

## 2015-08-19 ENCOUNTER — Telehealth: Payer: Self-pay | Admitting: Family

## 2015-08-19 DIAGNOSIS — F908 Attention-deficit hyperactivity disorder, other type: Secondary | ICD-10-CM

## 2015-08-19 MED ORDER — AMPHETAMINE-DEXTROAMPHETAMINE 10 MG PO TABS: 10.0000 mg | ORAL_TABLET | Freq: Every day | ORAL | Status: DC | PRN

## 2015-08-19 MED ORDER — AMPHETAMINE-DEXTROAMPHET ER 20 MG PO CP24: 20.0000 mg | ORAL_CAPSULE | Freq: Every day | ORAL | Status: DC

## 2015-08-19 NOTE — Telephone Encounter (Signed)
Medication refilled

## 2015-08-19 NOTE — Telephone Encounter (Signed)
Called and advised.

## 2015-08-19 NOTE — Telephone Encounter (Signed)
Patient is reqeusting a refill of   amphetamine-dextroamphetamine (ADDERALL) 10 MG tablet [[id number]][[id number]]      And amphetamine-dextroamphetamine (ADDERALL XR) 20 MG 24 hr capsule [[id number]][[id number]]

## 2015-09-20 ENCOUNTER — Telehealth: Payer: Self-pay | Admitting: *Deleted

## 2015-09-20 DIAGNOSIS — F908 Attention-deficit hyperactivity disorder, other type: Secondary | ICD-10-CM

## 2015-09-20 MED ORDER — AMPHETAMINE-DEXTROAMPHET ER 20 MG PO CP24: 20.0000 mg | ORAL_CAPSULE | Freq: Every day | ORAL | Status: DC

## 2015-09-20 MED ORDER — AMPHETAMINE-DEXTROAMPHETAMINE 10 MG PO TABS: 10.0000 mg | ORAL_TABLET | Freq: Every day | ORAL | Status: DC | PRN

## 2015-09-20 NOTE — Telephone Encounter (Signed)
Medication filled for pick up - will need OV for additional refills.

## 2015-09-20 NOTE — Telephone Encounter (Signed)
Called pt no answer LMOM rx ready for pick-up must make aoppt for next refill./lmb

## 2015-09-20 NOTE — Telephone Encounter (Signed)
Left msg on triage requesting refills on his Adderral both  & ...Raechel Chute

## 2015-10-12 ENCOUNTER — Telehealth: Payer: Self-pay | Admitting: Family

## 2015-10-12 NOTE — Telephone Encounter (Signed)
Please advise 

## 2015-10-12 NOTE — Telephone Encounter (Signed)
Pt called in to come see you for his medication refill for adderrall.  We can't get him in this week for an appointment. He is leaving town for a couple months for work. He says he supposes he could probably come back but the drive is 6 hours Please advise of what we can do for him.

## 2015-10-12 NOTE — Telephone Encounter (Signed)
Ok to work him in sometime this week.

## 2015-10-13 NOTE — Telephone Encounter (Signed)
lmovm to schedule a 15 min appt for whatever time works for pt.

## 2015-10-14 ENCOUNTER — Telehealth: Payer: Self-pay | Admitting: Family

## 2015-10-14 ENCOUNTER — Encounter: Payer: Self-pay | Admitting: Family

## 2015-10-14 ENCOUNTER — Ambulatory Visit (INDEPENDENT_AMBULATORY_CARE_PROVIDER_SITE_OTHER): Payer: BLUE CROSS/BLUE SHIELD | Admitting: Family

## 2015-10-14 ENCOUNTER — Ambulatory Visit (INDEPENDENT_AMBULATORY_CARE_PROVIDER_SITE_OTHER)
Admission: RE | Admit: 2015-10-14 | Discharge: 2015-10-14 | Disposition: A | Discharge status: Home / Self Care | Payer: BLUE CROSS/BLUE SHIELD | Source: Ambulatory Visit | Attending: Family | Admitting: Family

## 2015-10-14 VITALS — BP 136/88 | HR 105 | Temp 97.9°F | Resp 16 | Ht 68.0 in | Wt 178.0 lb

## 2015-10-14 DIAGNOSIS — F908 Attention-deficit hyperactivity disorder, other type: Secondary | ICD-10-CM | POA: Diagnosis not present

## 2015-10-14 DIAGNOSIS — R0789 Other chest pain: Secondary | ICD-10-CM

## 2015-10-14 DIAGNOSIS — F900 Attention-deficit hyperactivity disorder, predominantly inattentive type: Secondary | ICD-10-CM | POA: Diagnosis not present

## 2015-10-14 MED ORDER — AMPHETAMINE-DEXTROAMPHET ER 20 MG PO CP24: 20.0000 mg | ORAL_CAPSULE | Freq: Every day | ORAL | Status: DC

## 2015-10-14 MED ORDER — AMPHETAMINE-DEXTROAMPHETAMINE 10 MG PO TABS: 10.0000 mg | ORAL_TABLET | Freq: Every day | ORAL | Status: DC | PRN

## 2015-10-14 MED ORDER — IBUPROFEN-FAMOTIDINE 800-26.6 MG PO TABS: 1.0000 | ORAL_TABLET | Freq: Three times a day (TID) | ORAL | Status: DC | PRN

## 2015-10-14 NOTE — Patient Instructions (Signed)
Thank you for choosing [facility].  Summary/Instructions:  Please continue to take your medications as prescribed.  Ice 2-3 times per day as needed.  Duexis 1-3 times per day.  Your prescription(s) have been submitted to your pharmacy or been printed and provided for you. Please take as directed and contact our office if you believe you are having problem(s) with the medication(s) or have any questions.  Please stop by radiology on the basement level of the building for your x-rays. Your results will be released to MyChart (or called to you) after review, usually within 72 hours after test completion. If any treatments or changes are necessary, you will be notified at that same time.  If your symptoms worsen or fail to improve, please contact our office for further instruction, or in case of emergency go directly to the emergency room at the closest medical facility.   Costochondritis Costochondritis, sometimes called Tietze syndrome, is a swelling and irritation (inflammation) of the tissue (cartilage) that connects your ribs with your breastbone (sternum). It causes pain in the chest and rib area. Costochondritis usually goes away on its own over time. It can take up to 6 weeks or longer to get better, especially if you are unable to limit your activities. CAUSES  Some cases of costochondritis have no known cause. Possible causes include:  Injury (trauma).  Exercise or activity such as lifting.  Severe coughing. SIGNS AND SYMPTOMS  Pain and tenderness in the chest and rib area.  Pain that gets worse when coughing or taking deep breaths.  Pain that gets worse with specific movements. DIAGNOSIS  Your health care provider will do a physical exam and ask about your symptoms. Chest X-rays or other tests may be done to rule out other problems. TREATMENT  Costochondritis usually goes away on its own over time. Your health care provider may prescribe medicine to help relieve  pain. HOME CARE INSTRUCTIONS   Avoid exhausting physical activity. Try not to strain your ribs during normal activity. This would include any activities using chest, abdominal, and side muscles, especially if heavy weights are used.  Apply ice to the affected area for the first 2 days after the pain begins.  Put ice in a plastic bag.  Place a towel between your skin and the bag.  Leave the ice on for 20 minutes, 2-3 times a day.  Only take over-the-counter or prescription medicines as directed by your health care provider. SEEK MEDICAL CARE IF:  You have redness or swelling at the rib joints. These are signs of infection.  Your pain does not go away despite rest or medicine. SEEK IMMEDIATE MEDICAL CARE IF:   Your pain increases or you are very uncomfortable.  You have shortness of breath or difficulty breathing.  You cough up blood.  You have worse chest pains, sweating, or vomiting.  You have a fever or persistent symptoms for more than 2-3 days.  You have a fever and your symptoms suddenly get worse. MAKE SURE YOU:   Understand these instructions.  Will watch your condition.  Will get help right away if you are not doing well or get worse.   This information is not intended to replace advice given to you by your health care provider. Make sure you discuss any questions you have with your health care provider.   Document Released: 05/10/2005 Document Revised: 05/21/2013 Document Reviewed: 03/04/2013 Elsevier Interactive Patient Education [employer]
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
## 2015-10-14 NOTE — Telephone Encounter (Signed)
Please inform patient that his x-rays were normal with no evidence of fracture. Therefore please continue with the treatment plan as discussed during the office visit. Follow-up if symptoms worsen or do not improve.

## 2015-10-14 NOTE — Assessment & Plan Note (Signed)
ADHD is well controlled with current medication and no adverse side effects. Continue current dosage of Adderall XR and Adderall. [place] controlled substance database reviewed with no irregularities. Follow-up in 3 months.

## 2015-10-14 NOTE — Progress Notes (Signed)
Pre visit review using our clinic review tool, if applicable. No additional management support is needed unless otherwise documented below in the visit note. 

## 2015-10-14 NOTE — Telephone Encounter (Signed)
LVM for pt to call back as soon as possible.    Re: Results

## 2015-10-14 NOTE — Assessment & Plan Note (Signed)
Chest wall pain consistent with contusion of the chest wall with concern for underlying fracture. Obtain x-rays. Start Duexis as needed for pain. Ice 2-3 times per day. Splint the area when coughing, sneezing, or straining. Follow-up pending x-ray results or sooner if needed.

## 2015-10-21 NOTE — Telephone Encounter (Signed)
LVM for pt to call back as soon as possible.   

## 2015-10-22 NOTE — Telephone Encounter (Signed)
Letter sent to pt. Several attempts were made to contact but those attempts were unsuccessful.

## 2015-10-22 NOTE — Telephone Encounter (Signed)
Sending to my basket

## 2016-01-26 ENCOUNTER — Ambulatory Visit: Payer: BLUE CROSS/BLUE SHIELD | Admitting: Family

## 2016-01-26 DIAGNOSIS — Z0289 Encounter for other administrative examinations: Secondary | ICD-10-CM

## 2016-01-27 ENCOUNTER — Ambulatory Visit (INDEPENDENT_AMBULATORY_CARE_PROVIDER_SITE_OTHER): Payer: BLUE CROSS/BLUE SHIELD | Admitting: Family

## 2016-01-27 ENCOUNTER — Encounter: Payer: Self-pay | Admitting: Family

## 2016-01-27 VITALS — BP 136/86 | HR 71 | Temp 97.9°F | Resp 16 | Ht 68.0 in | Wt 175.4 lb

## 2016-01-27 DIAGNOSIS — F908 Attention-deficit hyperactivity disorder, other type: Secondary | ICD-10-CM

## 2016-01-27 MED ORDER — AMPHETAMINE-DEXTROAMPHETAMINE 10 MG PO TABS: 10.0000 mg | ORAL_TABLET | Freq: Every day | ORAL | Status: DC | PRN

## 2016-01-27 MED ORDER — AMPHETAMINE-DEXTROAMPHET ER 20 MG PO CP24: 20.0000 mg | ORAL_CAPSULE | Freq: Every day | ORAL | Status: DC

## 2016-01-27 NOTE — Assessment & Plan Note (Addendum)
ADHD appears stable with current regimen and no adverse side effects. Sleeping and eating well. No symptoms of cardiac issues. Controlled substance database reviewed with no irregularities. Continue current dosage of Adderall and Adderall XR. Follow-up in 3 months or sooner if needed..Marland Kitchen

## 2016-01-27 NOTE — Progress Notes (Signed)
Pre visit review using our clinic review tool, if applicable. No additional management support is needed unless otherwise documented below in the visit note. 
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
## 2016-01-27 NOTE — Patient Instructions (Signed)
Thank you for choosing [facility].  Summary/Instructions:  Please continue to take your medications as prescribed.   Your prescription(s) have been submitted to your pharmacy or been printed and provided for you. Please take as directed and contact our office if you believe you are having problem(s) with the medication(s) or have any questions.  If your symptoms worsen or fail to improve, please contact our office for further instruction, or in case of emergency go directly to the emergency room at the closest medical facility.     

## 2016-04-24 ENCOUNTER — Ambulatory Visit (INDEPENDENT_AMBULATORY_CARE_PROVIDER_SITE_OTHER): Payer: BLUE CROSS/BLUE SHIELD | Admitting: Family

## 2016-04-24 ENCOUNTER — Encounter: Payer: Self-pay | Admitting: Family

## 2016-04-24 DIAGNOSIS — F908 Attention-deficit hyperactivity disorder, other type: Secondary | ICD-10-CM | POA: Diagnosis not present

## 2016-04-24 MED ORDER — AMPHETAMINE-DEXTROAMPHETAMINE 10 MG PO TABS: 10.0000 mg | ORAL_TABLET | Freq: Every day | ORAL | 0 refills | Status: DC | PRN

## 2016-04-24 MED ORDER — AMPHETAMINE-DEXTROAMPHET ER 20 MG PO CP24: 20.0000 mg | ORAL_CAPSULE | Freq: Every day | ORAL | 0 refills | Status: DC
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
## 2016-04-24 NOTE — Patient Instructions (Signed)
Thank you for choosing [facility].  SUMMARY AND INSTRUCTIONS:  Medication:  Continue to take your medication as prescribed.   Your prescription(s) have been submitted to your pharmacy or been printed and provided for you. Please take as directed and contact our office if you believe you are having problem(s) with the medication(s) or have any questions.   Follow up:  If your symptoms worsen or fail to improve, please contact our office for further instruction, or in case of emergency go directly to the emergency room at the closest medical facility.

## 2016-04-24 NOTE — Assessment & Plan Note (Signed)
ADHD appears stable with current regimen and no adverse side effects. He reports sleeping and eating well with no cardiac symptoms. Attention is well controlled with current regimen. [place] controlled substance database reviewed with no irregularities. Continue current dosage of Adderall and follow-up in 3 months or sooner if needed.

## 2016-07-28 ENCOUNTER — Ambulatory Visit (INDEPENDENT_AMBULATORY_CARE_PROVIDER_SITE_OTHER): Payer: BLUE CROSS/BLUE SHIELD | Admitting: Family

## 2016-07-28 ENCOUNTER — Encounter: Payer: Self-pay | Admitting: Family

## 2016-07-28 DIAGNOSIS — F900 Attention-deficit hyperactivity disorder, predominantly inattentive type: Secondary | ICD-10-CM

## 2016-07-28 MED ORDER — AMPHETAMINE-DEXTROAMPHET ER 20 MG PO CP24
20.0000 mg | ORAL_CAPSULE | Freq: Every day | ORAL | 0 refills | Status: DC
Start: 2016-07-28 — End: 2016-07-28

## 2016-07-28 MED ORDER — AMPHETAMINE-DEXTROAMPHETAMINE 10 MG PO TABS: 10.0000 mg | ORAL_TABLET | Freq: Every day | ORAL | 0 refills | Status: DC | PRN

## 2016-07-28 MED ORDER — AMPHETAMINE-DEXTROAMPHET ER 20 MG PO CP24: 20.0000 mg | ORAL_CAPSULE | Freq: Every day | ORAL | 0 refills | Status: DC

## 2016-07-28 NOTE — Patient Instructions (Signed)
Thank you for choosing [facility].  SUMMARY AND INSTRUCTIONS:  Medication:  Please continue to take your medications as prescribed.   Your prescription(s) have been submitted to your pharmacy or been printed and provided for you. Please take as directed and contact our office if you believe you are having problem(s) with the medication(s) or have any questions.  Follow up:  If your symptoms worsen or fail to improve, please contact our office for further instruction, or in case of emergency go directly to the emergency room at the closest medical facility.     

## 2016-07-28 NOTE — Assessment & Plan Note (Signed)
ADHD appears adequately controlled with current regimen and no adverse side effects. Sleeping and eating well with no cardiac symptoms. [place] controlled substance database reviewed with no irregularities. Continue current dosage of Adderall XR and Adderall.
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
## 2016-10-02 ENCOUNTER — Telehealth: Payer: Self-pay | Admitting: Family

## 2016-10-02 NOTE — Telephone Encounter (Signed)
Pt called in and said that he lost his last month of script of adderall, he moved house and lost it in the process of moving and not sure what to do?    Best number [phone]

## 2016-10-03 NOTE — Telephone Encounter (Signed)
Pt called to check up on this request.  °

## 2016-10-03 NOTE — Telephone Encounter (Signed)
Please advise 

## 2016-10-04 MED ORDER — AMPHETAMINE-DEXTROAMPHET ER 20 MG PO CP24
20.0000 mg | ORAL_CAPSULE | Freq: Every day | ORAL | 0 refills | Status: DC
Start: 2016-10-04 — End: 2016-10-26

## 2016-10-04 MED ORDER — AMPHETAMINE-DEXTROAMPHETAMINE 10 MG PO TABS: 10.0000 mg | ORAL_TABLET | Freq: Every day | ORAL | 0 refills | Status: DC | PRN

## 2016-10-04 NOTE — Telephone Encounter (Signed)
LVM letting pt know.  

## 2016-10-04 NOTE — Telephone Encounter (Signed)
Medication refilled and available for pick up. 

## 2016-10-26 ENCOUNTER — Ambulatory Visit (INDEPENDENT_AMBULATORY_CARE_PROVIDER_SITE_OTHER): Payer: BLUE CROSS/BLUE SHIELD | Admitting: Family

## 2016-10-26 ENCOUNTER — Encounter: Payer: Self-pay | Admitting: Family

## 2016-10-26 DIAGNOSIS — F900 Attention-deficit hyperactivity disorder, predominantly inattentive type: Secondary | ICD-10-CM | POA: Diagnosis not present

## 2016-10-26 MED ORDER — AMPHETAMINE-DEXTROAMPHETAMINE 10 MG PO TABS: 10.0000 mg | ORAL_TABLET | Freq: Every day | ORAL | 0 refills | Status: DC | PRN

## 2016-10-26 MED ORDER — AMPHETAMINE-DEXTROAMPHET ER 20 MG PO CP24: 20.0000 mg | ORAL_CAPSULE | Freq: Every day | ORAL | 0 refills | Status: DC
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
## 2016-10-26 NOTE — Assessment & Plan Note (Signed)
ADHD appears stable with current medication regimen and no adverse side effects. Eating and sleeping well with no cardiac symptoms. [place] controlled substance database reviewed no irregularities. Continue current dosage of Adderall XL are and Adderall. Follow-up in 3 months or sooner if needed.

## 2016-10-26 NOTE — Patient Instructions (Signed)
Thank you for choosing [facility].  SUMMARY AND INSTRUCTIONS:  Please continue take your medications as prescribed.  Follow up in 3 months.   Medication:  Your prescription(s) have been submitted to your pharmacy or been printed and provided for you. Please take as directed and contact our office if you believe you are having problem(s) with the medication(s) or have any questions.  Follow up:  If your symptoms worsen or fail to improve, please contact our office for further instruction, or in case of emergency go directly to the emergency room at the closest medical facility.

## 2017-01-11 ENCOUNTER — Ambulatory Visit (INDEPENDENT_AMBULATORY_CARE_PROVIDER_SITE_OTHER): Payer: BLUE CROSS/BLUE SHIELD | Admitting: Family

## 2017-01-11 ENCOUNTER — Encounter: Payer: Self-pay | Admitting: Family

## 2017-01-11 VITALS — BP 124/74 | HR 84 | Temp 98.0°F | Resp 16 | Ht 68.0 in | Wt 178.0 lb

## 2017-01-11 DIAGNOSIS — Z202 Contact with and (suspected) exposure to infections with a predominantly sexual mode of transmission: Secondary | ICD-10-CM | POA: Diagnosis not present

## 2017-01-11 DIAGNOSIS — F900 Attention-deficit hyperactivity disorder, predominantly inattentive type: Secondary | ICD-10-CM | POA: Diagnosis not present

## 2017-01-11 MED ORDER — AMPHETAMINE-DEXTROAMPHETAMINE 20 MG PO TABS: 20.0000 mg | ORAL_TABLET | Freq: Every day | ORAL | 0 refills | Status: DC | PRN

## 2017-01-11 MED ORDER — AMPHETAMINE-DEXTROAMPHET ER 20 MG PO CP24: 20.0000 mg | ORAL_CAPSULE | Freq: Every day | ORAL | 0 refills | Status: DC

## 2017-01-11 MED ORDER — AZITHROMYCIN 500 MG PO TABS: ORAL_TABLET | ORAL | 0 refills | Status: DC

## 2017-01-11 NOTE — Patient Instructions (Signed)
Thank you for choosing [facility].  SUMMARY AND INSTRUCTIONS:  Start azithromycin once.   Continue to take your medications as prescribed.   Medication:  Your prescription(s) have been submitted to your pharmacy or been printed and provided for you. Please take as directed and contact our office if you believe you are having problem(s) with the medication(s) or have any questions.  Follow up:  If your symptoms worsen or fail to improve, please contact our office for further instruction, or in case of emergency go directly to the emergency room at the closest medical facility.

## 2017-01-11 NOTE — Assessment & Plan Note (Signed)
ADHD appears adequately controlled with current medication regimen with patient requests to increase as needed dosage to 20 mg as he feels he has much improvement with that dosage. Sleeping and eating well with no cardiac symptoms. [place] controlled substance database reviewed with no irregularities. Continue current dosage of Adderall XR and increase additional daily as needed Adderall.
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
## 2017-01-11 NOTE — Assessment & Plan Note (Signed)
New-onset exposure to sexually transmitted disease with concern for chlamydia. Start azithromycin for prophylactic treatment. Advised of safe sex practices to avoid transmission of STDs in the future. Follow-up as needed.

## 2017-05-07 ENCOUNTER — Ambulatory Visit (INDEPENDENT_AMBULATORY_CARE_PROVIDER_SITE_OTHER): Payer: BLUE CROSS/BLUE SHIELD | Admitting: Family

## 2017-05-07 ENCOUNTER — Encounter: Payer: Self-pay | Admitting: Family

## 2017-05-07 DIAGNOSIS — F900 Attention-deficit hyperactivity disorder, predominantly inattentive type: Secondary | ICD-10-CM | POA: Diagnosis not present

## 2017-05-07 MED ORDER — AMPHETAMINE-DEXTROAMPHETAMINE 20 MG PO TABS: 20.0000 mg | ORAL_TABLET | Freq: Every day | ORAL | 0 refills | Status: DC | PRN

## 2017-05-07 MED ORDER — AMPHETAMINE-DEXTROAMPHET ER 20 MG PO CP24: 20.0000 mg | ORAL_CAPSULE | Freq: Every day | ORAL | 0 refills | Status: DC

## 2017-05-07 NOTE — Assessment & Plan Note (Signed)
Stable with current dosage of Adderall and Aderall XR with no adverse side effects. Eating and sleeping well with no cardiac symptoms. [place] reviewed with no irregularities. Continue current dosage of Adderall and Aderall XR. Follow up in 3-4 months or sooner if needed.
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
## 2017-05-07 NOTE — Patient Instructions (Signed)
Thank you for choosing [facility].  SUMMARY AND INSTRUCTIONS:  Please continue to take your medications as prescribed.  Follow up with Alphonse Guild, NP or another provider for additional care.   Medication:  Your prescription(s) have been submitted to your pharmacy or been printed and provided for you. Please take as directed and contact our office if you believe you are having problem(s) with the medication(s) or have any questions.   Follow up:  If your symptoms worsen or fail to improve, please contact our office for further instruction, or in case of emergency go directly to the emergency room at the closest medical facility.

## 2017-08-02 ENCOUNTER — Encounter: Payer: Self-pay | Admitting: Internal Medicine

## 2017-08-02 ENCOUNTER — Ambulatory Visit: Payer: BLUE CROSS/BLUE SHIELD | Admitting: Internal Medicine

## 2017-08-02 VITALS — HR 104 | Temp 97.6°F | Ht 68.0 in | Wt 179.0 lb

## 2017-08-02 DIAGNOSIS — E7800 Pure hypercholesterolemia, unspecified: Secondary | ICD-10-CM

## 2017-08-02 DIAGNOSIS — F900 Attention-deficit hyperactivity disorder, predominantly inattentive type: Secondary | ICD-10-CM

## 2017-08-02 DIAGNOSIS — Z0000 Encounter for general adult medical examination without abnormal findings: Secondary | ICD-10-CM

## 2017-08-02 MED ORDER — AMPHETAMINE-DEXTROAMPHETAMINE 20 MG PO TABS: 20.0000 mg | ORAL_TABLET | Freq: Every day | ORAL | 0 refills | Status: DC | PRN

## 2017-08-02 MED ORDER — AMPHETAMINE-DEXTROAMPHET ER 20 MG PO CP24: 20.0000 mg | ORAL_CAPSULE | Freq: Every day | ORAL | 0 refills | Status: DC

## 2017-08-02 NOTE — Patient Instructions (Addendum)
Please continue all other medications as before, and refills have been done if requested.  Please have the pharmacy call with any other refills you may need.  Please continue your efforts at being more active, low cholesterol diet, and weight control.  Please keep your appointments with your specialists as you may have planned  Please return in 1 year for your yearly visit, or sooner if needed, with Lab testing done 3-5 days before  
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
## 2017-08-02 NOTE — Assessment & Plan Note (Signed)
stable overall by history and exam, recent data reviewed with pt, and pt to continue medical treatment as before,  to f/u any worsening symptoms or concerns, for med refill 

## 2017-08-02 NOTE — Assessment & Plan Note (Signed)
Declines lipids today
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
## 2017-09-05 ENCOUNTER — Telehealth: Payer: Self-pay | Admitting: Family

## 2017-09-05 DIAGNOSIS — F900 Attention-deficit hyperactivity disorder, predominantly inattentive type: Secondary | ICD-10-CM

## 2017-09-05 MED ORDER — AMPHETAMINE-DEXTROAMPHET ER 20 MG PO CP24: 20.0000 mg | ORAL_CAPSULE | Freq: Every day | ORAL | 0 refills | Status: DC

## 2017-09-05 MED ORDER — AMPHETAMINE-DEXTROAMPHETAMINE 20 MG PO TABS: 20.0000 mg | ORAL_TABLET | Freq: Every day | ORAL | 0 refills | Status: DC | PRN

## 2017-09-05 NOTE — Telephone Encounter (Signed)
Copied from CRM [id number]. Topic: Quick Communication - See Telephone Encounter >> Sep 05, 2017  4:28 PM Trula SladeWalter, Linda F wrote: CRM for notification. See Telephone encounter for:  09/05/17. Patient would like the following prescriptions refilled and [facility] [address]  1) amphetamine-dextroamphetamine (ADDERALL XR) 20 MG 24 hr capsule   2) amphetamine-dextroamphetamine (ADDERALL) 20 MG tablet

## 2017-09-05 NOTE — Telephone Encounter (Signed)
Both done erx 

## 2017-09-06 NOTE — Telephone Encounter (Signed)
Called pt no answer LMOM [facility].../lmb 

## 2017-10-08 ENCOUNTER — Other Ambulatory Visit: Payer: Self-pay | Admitting: Internal Medicine

## 2017-10-08 DIAGNOSIS — F900 Attention-deficit hyperactivity disorder, predominantly inattentive type: Secondary | ICD-10-CM

## 2017-10-08 MED ORDER — AMPHETAMINE-DEXTROAMPHET ER 20 MG PO CP24: 20.0000 mg | ORAL_CAPSULE | Freq: Every day | ORAL | 0 refills | Status: DC

## 2017-10-08 MED ORDER — AMPHETAMINE-DEXTROAMPHETAMINE 20 MG PO TABS: 20.0000 mg | ORAL_TABLET | Freq: Every day | ORAL | 0 refills | Status: DC | PRN

## 2017-10-08 NOTE — Telephone Encounter (Signed)
Copied from CRM [id number]. Topic: Quick Communication - Rx Refill/Question >> Oct 08, 2017  1:25 PM Henry BergeronBarksdale, Henry B wrote: Medication: amphetamine-dextroamphetamine (ADDERALL XR) 20 MG 24 hr capsule [[id number]][[id number]] , amphetamine-dextroamphetamine (ADDERALL) 20 MG tablet [[id number]][[id number]]     Has the patient contacted their pharmacy? No.   (Agent: If no, request that the patient contact the pharmacy for the refill.)   [facility] phone [facility]   Agent: Please be advised that RX refills may take up to 3 business days. We ask that you follow-up with your pharmacy.

## 2017-10-08 NOTE — Telephone Encounter (Signed)
LOV 08/02/17  Dr. Jonny RuizJohn  [facility] [id number] [facility] [place], [place]  [address]

## 2017-10-08 NOTE — Telephone Encounter (Signed)
Both done erx 

## 2017-11-05 ENCOUNTER — Telehealth: Payer: Self-pay | Admitting: Internal Medicine

## 2017-11-05 DIAGNOSIS — F900 Attention-deficit hyperactivity disorder, predominantly inattentive type: Secondary | ICD-10-CM

## 2017-11-05 MED ORDER — AMPHETAMINE-DEXTROAMPHET ER 20 MG PO CP24: 20.0000 mg | ORAL_CAPSULE | Freq: Every day | ORAL | 0 refills | Status: DC

## 2017-11-05 MED ORDER — AMPHETAMINE-DEXTROAMPHETAMINE 20 MG PO TABS: 20.0000 mg | ORAL_TABLET | Freq: Every day | ORAL | 0 refills | Status: DC | PRN

## 2017-11-05 NOTE — Telephone Encounter (Signed)
Last filled 10/08/2017 30#

## 2017-11-05 NOTE — Telephone Encounter (Signed)
Wanted doctor to know prescription needs to [facility] different [facility] as [facility] [address] does not have this medicine.

## 2017-11-05 NOTE — Telephone Encounter (Signed)
Done erx 

## 2017-11-05 NOTE — Telephone Encounter (Signed)
Copied from CRM [phone]. Topic: Quick Communication - Rx Refill/Question >> Nov 05, 2017 12:04 PM Cipriano BunkerLambe, Annette S wrote:  Medication: amphetamine-dextroamphetamine (ADDERALL XR) 20 MG 24  amphetamine-dextroamphetamine (ADDERALL) 20 MG tablet   Has the patient contacted their pharmacy? Yes.   (Agent: If no, request that the patient contact the pharmacy for the refill.)  Preferred Pharmacy (with phone number or street name):  [facility] [address]   Store # [phone]   Agent: Please be advised that RX refills may take up to 3 business days. We ask that you follow-up with your pharmacy.

## 2017-12-05 ENCOUNTER — Telehealth: Payer: Self-pay | Admitting: Internal Medicine

## 2017-12-05 DIAGNOSIS — F900 Attention-deficit hyperactivity disorder, predominantly inattentive type: Secondary | ICD-10-CM

## 2017-12-05 NOTE — Telephone Encounter (Signed)
Copied from CRM [id number]. Topic: Quick Communication - Rx Refill/Question >> Dec 05, 2017 12:22 PM Gloriann LoanPayne, Pebble Botkin L wrote: Medication: amphetamine-dextroamphetamine (ADDERALL XR) 20 MG 24 hr capsule   amphetamine-dextroamphetamine (ADDERALL) 20 MG tablet  Has the patient contacted their pharmacy? Yes.   (Agent: If no, request that the patient contact the pharmacy for the refill.) Preferred Pharmacy (with phone number or street name):[phone]- [place], [place] - [address] [phone] (Phone) [phone] (Fax)      Agent: Please be advised that RX refills may take up to 3 business days. We ask that you follow-up with your pharmacy.

## 2017-12-06 MED ORDER — AMPHETAMINE-DEXTROAMPHET ER 20 MG PO CP24: 20.0000 mg | ORAL_CAPSULE | Freq: Every day | ORAL | 0 refills | Status: DC

## 2017-12-06 NOTE — Telephone Encounter (Signed)
Done erx 

## 2017-12-06 NOTE — Telephone Encounter (Signed)
Pt requesting refill of Adderall XR 20mg  24hr capsule. Last filled on 11/05/17 #30  LOV: 08/02/17  PCP:Dr. Jonny RuizJohn  [facility] [address]

## 2017-12-17 ENCOUNTER — Other Ambulatory Visit: Payer: Self-pay | Admitting: Internal Medicine

## 2017-12-17 DIAGNOSIS — F900 Attention-deficit hyperactivity disorder, predominantly inattentive type: Secondary | ICD-10-CM

## 2017-12-17 NOTE — Telephone Encounter (Signed)
Copied from CRM [id number]. Topic: Quick Communication - Rx Refill/Question >> Dec 17, 2017 12:53 PM Oneal Grout wrote: Medication: amphetamine-dextroamphetamine (ADDERALL) 20 MG tablet  Has the patient contacted their pharmacy? Yes.   (Agent: If no, request that the patient contact the pharmacy for the refill.) Preferred Pharmacy (with phone number or street name): [facility] [address] Agent: Please be advised that RX refills may take up to 3 business days. We ask that you follow-up with your pharmacy.

## 2017-12-18 MED ORDER — AMPHETAMINE-DEXTROAMPHETAMINE 20 MG PO TABS: 20.0000 mg | ORAL_TABLET | Freq: Every day | ORAL | 0 refills | Status: DC | PRN

## 2017-12-18 NOTE — Telephone Encounter (Signed)
Done erx 

## 2017-12-18 NOTE — Telephone Encounter (Signed)
Rx refill request: Adderall 20 mg                  Last filled: 11/05/17  # 30  LOV: 08/02/17  PCP: Jonny Ruiz  Pharmacy: verified

## 2018-01-08 ENCOUNTER — Other Ambulatory Visit: Payer: Self-pay | Admitting: Internal Medicine

## 2018-01-08 DIAGNOSIS — F900 Attention-deficit hyperactivity disorder, predominantly inattentive type: Secondary | ICD-10-CM

## 2018-01-08 NOTE — Telephone Encounter (Signed)
Copied from CRM [id number]. Topic: Quick Communication - Rx Refill/Question >> Jan 08, 2018 12:54 PM Mila Palmer, Tollie Canada B, NT wrote: Medication: amphetamine-dextroamphetamine (ADDERALL) 20 MG tablet  amphetamine-dextroamphetamine (ADDERALL XR) 20 MG 24 hr capsule  Has the patient contacted their pharmacy? Yes.   (Agent: If no, request that the patient contact the pharmacy for the refill.) (Agent: If yes, when and what did the pharmacy advise?)  Preferred Pharmacy (with phone number or street name): [id number] [place], [place] - [address]  Agent: Please be advised that RX refills may take up to 3 business days. We ask that you follow-up with your pharmacy.

## 2018-01-08 NOTE — Telephone Encounter (Signed)
Adderall  24hr refill Last Refill:12/06/17 #30 Last OV: 08/02/17 PCP: Dr. Jonny Ruiz [facility]   [address]  Adderall  prn refill Last refill: 12/18/17 #30

## 2018-01-09 MED ORDER — AMPHETAMINE-DEXTROAMPHETAMINE 20 MG PO TABS: 20.0000 mg | ORAL_TABLET | Freq: Every day | ORAL | 0 refills | Status: DC | PRN

## 2018-01-09 MED ORDER — AMPHETAMINE-DEXTROAMPHET ER 20 MG PO CP24: 20.0000 mg | ORAL_CAPSULE | Freq: Every day | ORAL | 0 refills | Status: DC

## 2018-01-09 NOTE — Telephone Encounter (Signed)
Done erx 

## 2018-02-21 ENCOUNTER — Other Ambulatory Visit: Payer: Self-pay | Admitting: Internal Medicine

## 2018-02-21 DIAGNOSIS — F900 Attention-deficit hyperactivity disorder, predominantly inattentive type: Secondary | ICD-10-CM

## 2018-02-21 NOTE — Telephone Encounter (Signed)
Adderall refill Last OV:08/02/17 Last refill:01/09/18 30 tab/0 refill NWG:NFAOPCP:John Pharmacy: [id number]- [place], [place] - [address] [phone] (Phone) [phone] (Fax)

## 2018-02-21 NOTE — Telephone Encounter (Signed)
Copied from CRM [phone]. Topic: Quick Communication - Rx Refill/Question >> Feb 21, 2018  4:20 PM Arlyss Gandyichardson, Omega Durante N, NT wrote: Medication: amphetamine-dextroamphetamine (ADDERALL XR) 20 MG 24 hr capsule and amphetamine-dextroamphetamine (ADDERALL) 20 MG tablet  Has the patient contacted their pharmacy? Yes.   (Agent: If no, request that the patient contact the pharmacy for the refill.) (Agent: If yes, when and what did the pharmacy advise?)  Preferred Pharmacy (with phone number or street name): [id number] Otto- [place], [place] - [address] [phone] (Phone) [phone] (Fax)      Agent: Please be advised that RX refills may take up to 3 business days. We ask that you follow-up with your pharmacy.

## 2018-02-22 MED ORDER — AMPHETAMINE-DEXTROAMPHET ER 20 MG PO CP24: 20.0000 mg | ORAL_CAPSULE | Freq: Every day | ORAL | 0 refills | Status: DC

## 2018-02-22 MED ORDER — AMPHETAMINE-DEXTROAMPHETAMINE 20 MG PO TABS: 20.0000 mg | ORAL_TABLET | Freq: Every day | ORAL | 0 refills | Status: DC | PRN

## 2018-02-22 NOTE — Telephone Encounter (Signed)
Done erx x 2 

## 2018-03-26 ENCOUNTER — Other Ambulatory Visit: Payer: Self-pay | Admitting: Internal Medicine

## 2018-03-26 DIAGNOSIS — F900 Attention-deficit hyperactivity disorder, predominantly inattentive type: Secondary | ICD-10-CM

## 2018-03-26 MED ORDER — AMPHETAMINE-DEXTROAMPHETAMINE 20 MG PO TABS: 20.0000 mg | ORAL_TABLET | Freq: Every day | ORAL | 0 refills | Status: DC | PRN

## 2018-03-26 MED ORDER — AMPHETAMINE-DEXTROAMPHET ER 20 MG PO CP24: 20.0000 mg | ORAL_CAPSULE | Freq: Every day | ORAL | 0 refills | Status: DC

## 2018-03-26 NOTE — Telephone Encounter (Signed)
Copied from CRM [id number]. Topic: Quick Communication - Rx Refill/Question >> Mar 26, 2018  9:27 AM Zada GirtLander, Lumin L wrote: Medication: amphetamine-dextroamphetamine (ADDERALL XR) 20 MG 24 hr capsule, amphetamine-dextroamphetamine (ADDERALL) 20 MG tablet   Has the patient contacted their pharmacy? Yes.   (Agent: If no, request that the patient contact the pharmacy for the refill.) (Agent: If yes, when and what did the pharmacy advise?)  Preferred Pharmacy (with phone number or street name): [facility] [phone][id number] Otto- [place], [place] - [address] [place] [address] Phone: [phone] Fax: [phone]    Agent: Please be advised that RX refills may take up to 3 business days. We ask that you follow-up with your pharmacy.

## 2018-03-26 NOTE — Telephone Encounter (Signed)
Rx refill request: Adderall XR 20 mg     Last filed: 02/22/18                            Adderall 20 mg           Last filled: 02/22/18  LOV: 08/02/17  PCP: Jonny RuizJohn  Pharmacy: verified

## 2018-03-26 NOTE — Telephone Encounter (Signed)
Done erx 

## 2018-05-02 ENCOUNTER — Telehealth: Payer: Self-pay | Admitting: Internal Medicine

## 2018-05-02 DIAGNOSIS — F900 Attention-deficit hyperactivity disorder, predominantly inattentive type: Secondary | ICD-10-CM

## 2018-05-02 MED ORDER — AMPHETAMINE-DEXTROAMPHETAMINE 20 MG PO TABS: 20.0000 mg | ORAL_TABLET | Freq: Every day | ORAL | 0 refills | Status: DC | PRN

## 2018-05-02 MED ORDER — AMPHETAMINE-DEXTROAMPHET ER 20 MG PO CP24: 20.0000 mg | ORAL_CAPSULE | Freq: Every day | ORAL | 0 refills | Status: DC

## 2018-05-02 NOTE — Telephone Encounter (Signed)
Done erx 

## 2018-05-02 NOTE — Telephone Encounter (Signed)
Adderal XR20 mg refill Last Refill:03/26/18 # 30 Last OV: 08/02/17 PCP: Oliver Barrejames John [id number]

## 2018-05-02 NOTE — Telephone Encounter (Signed)
Copied from CRM [id number]. Topic: General - Other >> May 02, 2018  9:24 AM Ronney LionArrington, Shykila A wrote: Medication- amphetamine-dextroamphetamine (ADDERALL XR) 20 MG 24 hr capsule, amphetamine-dextroamphetamine (ADDERALL) 20 MG tablet  Has the patient contacted their pharmacy? yes    Preferred Pharmacy (with phone number or street name): [facility] [phone][id number] Otto- [place], [place] - [address]  [phone] (Phone) [phone] (Fax)

## 2018-05-31 ENCOUNTER — Telehealth: Payer: Self-pay | Admitting: Internal Medicine

## 2018-05-31 DIAGNOSIS — F900 Attention-deficit hyperactivity disorder, predominantly inattentive type: Secondary | ICD-10-CM

## 2018-05-31 MED ORDER — AMPHETAMINE-DEXTROAMPHET ER 20 MG PO CP24: 20.0000 mg | ORAL_CAPSULE | Freq: Every day | ORAL | 0 refills | Status: DC

## 2018-05-31 MED ORDER — AMPHETAMINE-DEXTROAMPHETAMINE 20 MG PO TABS: 20.0000 mg | ORAL_TABLET | Freq: Every day | ORAL | 0 refills | Status: DC | PRN

## 2018-05-31 NOTE — Telephone Encounter (Signed)
Done erx 

## 2018-05-31 NOTE — Telephone Encounter (Signed)
Copied from CRM [id number]. Topic: Quick Communication - Rx Refill/Question >> May 31, 2018  3:16 PM Jens Som A wrote: Medication: amphetamine-dextroamphetamine (ADDERALL XR) 20 MG 24 hr capsule [[id number]]   Has the patient contacted their pharmacy? Yes  (Agent: If no, request that the patient contact the pharmacy for the refill.) (Agent: If yes, when and what did the pharmacy advise?)  Preferred Pharmacy (with phone number or street name): [facility] [phone] [facility], [place] - [address] [place] [address] Phone: [phone] Fax: [phone]    Agent: Please be advised that RX refills may take up to 3 business days. We ask that you follow-up with your pharmacy.

## 2018-07-03 ENCOUNTER — Other Ambulatory Visit: Payer: Self-pay | Admitting: Internal Medicine

## 2018-07-03 DIAGNOSIS — F900 Attention-deficit hyperactivity disorder, predominantly inattentive type: Secondary | ICD-10-CM

## 2018-07-03 NOTE — Telephone Encounter (Signed)
Copied from CRM [id number]. Topic: General - Other >> Jul 03, 2018  9:01 AM Ronney LionArrington, Shykila A wrote: Medication: amphetamine-dextroamphetamine (ADDERALL) 20 MG tablet , amphetamine-dextroamphetamine (ADDERALL XR) 20 MG 24 hr capsule   Has the patient contacted their pharmacy? Yes  Preferred Pharmacy (with phone number or street name): [facility] [phone]#[id number] [facility] Otto- [place], [place] - [address]  [phone] (Phone) [phone] (Fax)    Agent: Please be advised that RX refills may take up to 3 business days. We ask that you follow-up with your pharmacy.

## 2018-07-03 NOTE — Telephone Encounter (Signed)
Requested medication (s) are due for refill today: yes  Requested medication (s) are on the active medication list: yes  Last refill:  05/31/18 #30  Future visit scheduled: No  Notes to clinic:  Unable to fill pr protocol    Requested Prescriptions  Pending Prescriptions Disp Refills   amphetamine-dextroamphetamine (ADDERALL XR) 20 MG 24 hr capsule 30 capsule 0    Sig: Take 1 capsule (20 mg total) by mouth daily.     Not Delegated - Psychiatry:  Stimulants/ADHD Failed - 07/03/2018  5:11 PM      Failed - This refill cannot be delegated      Failed - Urine Drug Screen completed in last 360 days.      Failed - Valid encounter within last 3 months    Recent Outpatient Visits          11 months ago Attention deficit hyperactivity disorder (ADHD), predominantly inattentive type   [facility], James W, MD   1 year ago Attention deficit hyperactivity disorder (ADHD), predominantly inattentive type   [facility] Calone, Gregory D, FNP   1 year ago Attention deficit hyperactivity disorder (ADHD), predominantly inattentive type   [facility]one, Gregory D, FNP   1 year ago Attention deficit hyperactivity disorder (ADHD), predominantly inattentive type   [facility]one, Gregory D, FNP   1 year ago Attention deficit hyperactivity disorder (ADHD), predominantly inattentive type   [facility] -[facility] Calone, Tama HeadingsGregory D, FNP            amphetamine-dextroamphetamine (ADDERALL) 20 MG tablet 30 tablet 0    Sig: Take 1 tablet (20 mg total) by mouth daily as needed. Mid day prn only     Not Delegated - Psychiatry:  Stimulants/ADHD Failed - 07/03/2018  5:11 PM      Failed - This refill cannot be delegated      Failed - Urine Drug Screen completed in last 360 days.      Failed - Valid encounter within last 3 months    Recent Outpatient Visits          11  months ago Attention deficit hyperactivity disorder (ADHD), predominantly inattentive type   [facility], James W, MD   1 year ago Attention deficit hyperactivity disorder (ADHD), predominantly inattentive type   [facility] -[facility] Calone, Gregory D, FNP   1 year ago Attention deficit hyperactivity disorder (ADHD), predominantly inattentive type   [facility] -[facility] Calone, Gregory D, FNP   1 year ago Attention deficit hyperactivity disorder (ADHD), predominantly inattentive type   [facility] -[facility] Calone, Gregory D, FNP   1 year ago Attention deficit hyperactivity disorder (ADHD), predominantly inattentive type   [facility] Calone, Gregory D, FNP

## 2018-07-04 MED ORDER — AMPHETAMINE-DEXTROAMPHETAMINE 20 MG PO TABS: 20.0000 mg | ORAL_TABLET | Freq: Every day | ORAL | 0 refills | Status: DC | PRN

## 2018-07-04 MED ORDER — AMPHETAMINE-DEXTROAMPHET ER 20 MG PO CP24: 20.0000 mg | ORAL_CAPSULE | Freq: Every day | ORAL | 0 refills | Status: DC

## 2018-07-04 NOTE — Telephone Encounter (Signed)
Done erx 

## 2018-08-19 ENCOUNTER — Other Ambulatory Visit: Payer: Self-pay | Admitting: Internal Medicine

## 2018-08-19 DIAGNOSIS — F900 Attention-deficit hyperactivity disorder, predominantly inattentive type: Secondary | ICD-10-CM

## 2018-08-19 NOTE — Telephone Encounter (Signed)
Dr. Jonny Ruiz please advise. Pt has not been seen since 07/2017.

## 2018-08-19 NOTE — Telephone Encounter (Signed)
Pt appt set up for CPE in February, can med be sent in until then

## 2018-08-19 NOTE — Telephone Encounter (Signed)
Copied from CRM [id number]. Topic: Quick Communication - Rx Refill/Question >> Aug 19, 2018  9:10 AM Jens SomMedley, Jennifer A wrote: Medication: amphetamine-dextroamphetamine (ADDERALL XR) 20 MG 24 hr capsule [[id number]][[id number]] , amphetamine-dextroamphetamine (ADDERALL) 20 MG tablet [[id number]][[id number]]  Has the patient contacted their pharmacy? Yes  (Agent: If no, request that the patient contact the pharmacy for the refill.) (Agent: If yes, when and what did the pharmacy advise?)  Preferred Pharmacy (with phone number or street name): [id number] [place], [place] - [address] [phone] (Phone) [phone] (Fax)    Agent: Please be advised that RX refills may take up to 3 business days. We ask that you follow-up with your pharmacy.

## 2018-08-20 NOTE — Telephone Encounter (Signed)
Has already been done jan 6 I think  Please ask pt to make rov for further refills

## 2018-08-22 ENCOUNTER — Telehealth: Payer: Self-pay | Admitting: Internal Medicine

## 2018-08-22 DIAGNOSIS — F900 Attention-deficit hyperactivity disorder, predominantly inattentive type: Secondary | ICD-10-CM

## 2018-08-22 MED ORDER — AMPHETAMINE-DEXTROAMPHETAMINE 20 MG PO TABS: 20.0000 mg | ORAL_TABLET | Freq: Every day | ORAL | 0 refills | Status: DC | PRN

## 2018-08-22 MED ORDER — AMPHETAMINE-DEXTROAMPHET ER 20 MG PO CP24: 20.0000 mg | ORAL_CAPSULE | Freq: Every day | ORAL | 0 refills | Status: DC

## 2018-08-22 NOTE — Telephone Encounter (Signed)
Copied from CRM [id number]. Topic: Quick Communication - See Telephone Encounter >> Aug 22, 2018 11:48 AM Windy Kalata, NT wrote: CRM for notification. See Telephone encounter for: 08/22/18.  Patient is calling and states his physical is on 09/18/18 and he is needing a refill on the following medications to get him to his appointment.  amphetamine-dextroamphetamine (ADDERALL XR) 20 MG 24 hr capsule  amphetamine-dextroamphetamine (ADDERALL XR) 20 MG tablet  [id number] [place], [place] - [address]  [place] [address] Phone: [phone] Fax: [phone]

## 2018-08-22 NOTE — Telephone Encounter (Signed)
Done erx 

## 2018-09-18 ENCOUNTER — Ambulatory Visit (INDEPENDENT_AMBULATORY_CARE_PROVIDER_SITE_OTHER): Payer: BLUE CROSS/BLUE SHIELD | Admitting: Internal Medicine

## 2018-09-18 ENCOUNTER — Encounter: Payer: Self-pay | Admitting: Internal Medicine

## 2018-09-18 ENCOUNTER — Other Ambulatory Visit (INDEPENDENT_AMBULATORY_CARE_PROVIDER_SITE_OTHER): Payer: BLUE CROSS/BLUE SHIELD

## 2018-09-18 VITALS — BP 124/86 | HR 107 | Temp 98.3°F | Ht 68.0 in | Wt 170.0 lb

## 2018-09-18 DIAGNOSIS — F900 Attention-deficit hyperactivity disorder, predominantly inattentive type: Secondary | ICD-10-CM

## 2018-09-18 DIAGNOSIS — Z0000 Encounter for general adult medical examination without abnormal findings: Secondary | ICD-10-CM | POA: Diagnosis not present

## 2018-09-18 DIAGNOSIS — Z0001 Encounter for general adult medical examination with abnormal findings: Secondary | ICD-10-CM | POA: Insufficient documentation

## 2018-09-18 DIAGNOSIS — E7800 Pure hypercholesterolemia, unspecified: Secondary | ICD-10-CM | POA: Diagnosis not present

## 2018-09-18 DIAGNOSIS — J029 Acute pharyngitis, unspecified: Secondary | ICD-10-CM

## 2018-09-18 LAB — CBC WITH DIFFERENTIAL/PLATELET
Basophils Absolute: 0 10*3/uL (ref 0.0–0.1)
Basophils Relative: 0.3 % (ref 0.0–3.0)
Eosinophils Absolute: 0.1 10*3/uL (ref 0.0–0.7)
Eosinophils Relative: 0.7 % (ref 0.0–5.0)
HCT: 43.8 % (ref 39.0–52.0)
Hemoglobin: 15.1 g/dL (ref 13.0–17.0)
Lymphocytes Relative: 13.9 % (ref 12.0–46.0)
Lymphs Abs: 1.5 10*3/uL (ref 0.7–4.0)
MCHC: 34.4 g/dL (ref 30.0–36.0)
MCV: 89.1 fl (ref 78.0–100.0)
MONO ABS: 1.3 10*3/uL — AB (ref 0.1–1.0)
Monocytes Relative: 12.1 % — ABNORMAL HIGH (ref 3.0–12.0)
Neutro Abs: 8 10*3/uL — ABNORMAL HIGH (ref 1.4–7.7)
Neutrophils Relative %: 73 % (ref 43.0–77.0)
Platelets: 222 10*3/uL (ref 150.0–400.0)
RBC: 4.91 Mil/uL (ref 4.22–5.81)
RDW: 13.1 % (ref 11.5–15.5)
WBC: 11 10*3/uL — AB (ref 4.0–10.5)

## 2018-09-18 LAB — HEPATIC FUNCTION PANEL
ALT: 18 U/L (ref 0–53)
AST: 16 U/L (ref 0–37)
Albumin: 4.5 g/dL (ref 3.5–5.2)
Alkaline Phosphatase: 61 U/L (ref 39–117)
Bilirubin, Direct: 0.1 mg/dL (ref 0.0–0.3)
Total Bilirubin: 0.7 mg/dL (ref 0.2–1.2)
Total Protein: 7.2 g/dL (ref 6.0–8.3)

## 2018-09-18 LAB — LIPID PANEL
CHOLESTEROL: 144 mg/dL (ref 0–200)
HDL: 47.1 mg/dL (ref >39.00)
LDL Cholesterol: 74 mg/dL (ref 0–99)
NonHDL: 97.13
Total CHOL/HDL Ratio: 3
Triglycerides: 115 mg/dL (ref 0.0–149.0)
VLDL: 23 mg/dL (ref 0.0–40.0)

## 2018-09-18 LAB — BASIC METABOLIC PANEL
BUN: 12 mg/dL (ref 6–23)
CO2: 29 mEq/L (ref 19–32)
Calcium: 9.3 mg/dL (ref 8.4–10.5)
Chloride: 103 mEq/L (ref 96–112)
Creatinine, Ser: 0.79 mg/dL (ref 0.40–1.50)
GFR: 118.69 mL/min (ref >60.00)
GLUCOSE: 71 mg/dL (ref 70–99)
POTASSIUM: 3.8 meq/L (ref 3.5–5.1)
Sodium: 140 mEq/L (ref 135–145)

## 2018-09-18 LAB — URINALYSIS, ROUTINE W REFLEX MICROSCOPIC
Bilirubin Urine: NEGATIVE
Hgb urine dipstick: NEGATIVE
KETONES UR: NEGATIVE
Leukocytes, UA: NEGATIVE
Nitrite: NEGATIVE
RBC / HPF: NONE SEEN (ref 0–?)
Specific Gravity, Urine: 1.015 (ref 1.000–1.030)
Total Protein, Urine: NEGATIVE
Urine Glucose: NEGATIVE
Urobilinogen, UA: 1 (ref 0.0–1.0)
pH: 7.5 (ref 5.0–8.0)

## 2018-09-18 LAB — TSH: TSH: 3.27 u[IU]/mL (ref 0.35–4.50)

## 2018-09-18 MED ORDER — AMPHETAMINE-DEXTROAMPHETAMINE 20 MG PO TABS: 20.0000 mg | ORAL_TABLET | Freq: Every day | ORAL | 0 refills | Status: DC | PRN

## 2018-09-18 MED ORDER — AMPHETAMINE-DEXTROAMPHET ER 20 MG PO CP24: 20.0000 mg | ORAL_CAPSULE | Freq: Every day | ORAL | 0 refills | Status: DC

## 2018-09-18 MED ORDER — AZITHROMYCIN 250 MG PO TABS: ORAL_TABLET | ORAL | 1 refills | Status: DC

## 2018-09-18 NOTE — Assessment & Plan Note (Signed)
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
## 2018-09-18 NOTE — Assessment & Plan Note (Signed)
Stable, cont current adderall

## 2018-09-18 NOTE — Assessment & Plan Note (Addendum)
Mild to mod, for antibx course,  to f/u any worsening symptoms or concerns  In addition to the time spent performing CPE, I spent an additional 15 minutes face to face,in which greater than 50% of this time was spent in counseling and coordination of care for patient's illness as documented, including the differential dx, treatment, further evaluation and other management of acute pharyngitis, ADD, HLD

## 2018-09-18 NOTE — Assessment & Plan Note (Signed)
Improved with wt loss and better diet, cont low chol diet

## 2018-09-18 NOTE — Patient Instructions (Signed)
Please take all new medication as prescribed - the antibiotic  Please continue all other medications as before, and refills have been done if requested - the adderall  Please have the pharmacy call with any other refills you may need.  Please continue your efforts at being more active, low cholesterol diet, and weight control.  You are otherwise up to date with prevention measures today.  Please keep your appointments with your specialists as you may have planned  Please go to the LAB in the Basement (turn left off the elevator) for the tests to be done today  You will be contacted by phone if any changes need to be made immediately.  Otherwise, you will receive a letter about your results with an explanation, but please check with MyChart first.  Please remember to sign up for MyChart if you have not done so, as this will be important to you in the future with finding out test results, communicating by private email, and scheduling acute appointments online when needed.  Please return in 1 year for your yearly visit, or sooner if needed

## 2018-10-15 ENCOUNTER — Other Ambulatory Visit: Payer: Self-pay | Admitting: Internal Medicine

## 2018-10-15 DIAGNOSIS — F900 Attention-deficit hyperactivity disorder, predominantly inattentive type: Secondary | ICD-10-CM

## 2018-10-15 MED ORDER — AMPHETAMINE-DEXTROAMPHETAMINE 20 MG PO TABS: 20.0000 mg | ORAL_TABLET | Freq: Every day | ORAL | 0 refills | Status: DC | PRN

## 2018-10-15 MED ORDER — AMPHETAMINE-DEXTROAMPHET ER 20 MG PO CP24: 20.0000 mg | ORAL_CAPSULE | Freq: Every day | ORAL | 0 refills | Status: DC

## 2018-10-15 NOTE — Telephone Encounter (Signed)
Copied from CRM [id number]. Topic: Quick Communication - Rx Refill/Question >> Oct 15, 2018 10:13 AM Mickel Baas B, NT wrote: Medication: amphetamine-dextroamphetamine (ADDERALL XR) 20 MG 24 hr capsule amphetamine-dextroamphetamine (ADDERALL) 20 MG tablet  Has the patient contacted their pharmacy? Yes.   (Agent: If no, request that the patient contact the pharmacy for the refill.) (Agent: If yes, when and what did the pharmacy advise?)  Preferred Pharmacy (with phone number or street name): [id number] [place] [place] - [address]  Agent: Please be advised that RX refills may take up to 3 business days. We ask that you follow-up with your pharmacy.

## 2018-10-15 NOTE — Telephone Encounter (Signed)
Done erx 

## 2018-10-15 NOTE — Telephone Encounter (Signed)
Requested medication (s) are due for refill today: yes  Requested medication (s) are on the active medication list: yes  Last refill:  Adderall XR 20: 09/18/18 #30                   Adderall 20 mg 09/18/18 #30   Future visit scheduled: no  Notes to clinic:  Medications not delegated to NT torefill   Requested Prescriptions  Pending Prescriptions Disp Refills   amphetamine-dextroamphetamine (ADDERALL XR) 20 MG 24 hr capsule 30 capsule 0    Sig: Take 1 capsule (20 mg total) by mouth daily.     Not Delegated - Psychiatry:  Stimulants/ADHD Failed - 10/15/2018 10:22 AM      Failed - This refill cannot be delegated      Failed - Urine Drug Screen completed in last 360 days.      Passed - Valid encounter within last 3 months    Recent Outpatient Visits          3 weeks ago Wellness examination   [facility] -Clair Gulling, MD   1 year ago Attention deficit hyperactivity disorder (ADHD), predominantly inattentive type   [facility] -Clair Gulling, MD   1 year ago Attention deficit hyperactivity disorder (ADHD), predominantly inattentive type   [facility] -Olive Bass, FNP   1 year ago Attention deficit hyperactivity disorder (ADHD), predominantly inattentive type   [facility] -Olive Bass, FNP   1 year ago Attention deficit hyperactivity disorder (ADHD), predominantly inattentive type   [facility] -Darnelle Catalan, Tama Headings, FNP            amphetamine-dextroamphetamine (ADDERALL) 20 MG tablet 30 tablet 0    Sig: Take 1 tablet (20 mg total) by mouth daily as needed. Mid day prn only     Not Delegated - Psychiatry:  Stimulants/ADHD Failed - 10/15/2018 10:22 AM      Failed - This refill cannot be delegated      Failed - Urine Drug Screen completed in last 360 days.      Passed - Valid encounter within last 3 months    Recent Outpatient Visits          3  weeks ago Wellness examination   [facility] -Clair Gulling, MD   1 year ago Attention deficit hyperactivity disorder (ADHD), predominantly inattentive type   [facility] -Clair Gulling, MD   1 year ago Attention deficit hyperactivity disorder (ADHD), predominantly inattentive type   [facility] -Olive Bass, FNP   1 year ago Attention deficit hyperactivity disorder (ADHD), predominantly inattentive type   [facility] -Olive Bass, FNP   1 year ago Attention deficit hyperactivity disorder (ADHD), predominantly inattentive type   [facility] -Olive Bass, FNP

## 2018-11-15 ENCOUNTER — Ambulatory Visit: Payer: Self-pay

## 2018-11-15 ENCOUNTER — Other Ambulatory Visit: Payer: Self-pay

## 2018-11-15 ENCOUNTER — Other Ambulatory Visit: Payer: Self-pay | Admitting: Family Medicine

## 2018-11-15 DIAGNOSIS — Z0000 Encounter for general adult medical examination without abnormal findings: Secondary | ICD-10-CM

## 2018-11-21 ENCOUNTER — Other Ambulatory Visit: Payer: Self-pay | Admitting: Internal Medicine

## 2018-11-21 DIAGNOSIS — F900 Attention-deficit hyperactivity disorder, predominantly inattentive type: Secondary | ICD-10-CM

## 2018-11-21 MED ORDER — AMPHETAMINE-DEXTROAMPHETAMINE 20 MG PO TABS: 20.0000 mg | ORAL_TABLET | Freq: Every day | ORAL | 0 refills | Status: DC | PRN

## 2018-11-21 MED ORDER — AMPHETAMINE-DEXTROAMPHET ER 20 MG PO CP24: 20.0000 mg | ORAL_CAPSULE | Freq: Every day | ORAL | 0 refills | Status: DC

## 2018-11-21 NOTE — Telephone Encounter (Signed)
Pt. Left vm on Rx Refill line with request for Adderall immediate and extended release.  [facility] [address]  Will forward to the office.

## 2018-11-21 NOTE — Telephone Encounter (Signed)
Done erx 

## 2018-11-21 NOTE — Addendum Note (Signed)
Addended by: Corwin Levins on: 11/21/2018 01:39 PM   Modules accepted: Orders

## 2018-11-27 MED ORDER — AMPHETAMINE-DEXTROAMPHETAMINE 20 MG PO TABS: 20.0000 mg | ORAL_TABLET | Freq: Every day | ORAL | 0 refills | Status: DC | PRN

## 2018-11-27 MED ORDER — AMPHETAMINE-DEXTROAMPHET ER 20 MG PO CP24: 20.0000 mg | ORAL_CAPSULE | Freq: Every day | ORAL | 0 refills | Status: DC

## 2018-11-27 NOTE — Addendum Note (Signed)
Addended by: Corwin Levins on: 11/27/2018 03:15 PM   Modules accepted: Orders

## 2018-11-27 NOTE — Telephone Encounter (Signed)
[facility] and [facility] so Pt would like the medications called into [facility] [address], [place] - [address] [phone] (Phone) [phone] (Fax)

## 2018-11-27 NOTE — Telephone Encounter (Signed)
Done erx 

## 2018-12-24 ENCOUNTER — Telehealth: Payer: Self-pay | Admitting: Internal Medicine

## 2018-12-24 DIAGNOSIS — F900 Attention-deficit hyperactivity disorder, predominantly inattentive type: Secondary | ICD-10-CM

## 2018-12-24 MED ORDER — AMPHETAMINE-DEXTROAMPHET ER 20 MG PO CP24: 20.0000 mg | ORAL_CAPSULE | Freq: Every day | ORAL | 0 refills | Status: DC

## 2018-12-24 MED ORDER — AMPHETAMINE-DEXTROAMPHETAMINE 20 MG PO TABS: 20.0000 mg | ORAL_TABLET | Freq: Every day | ORAL | 0 refills | Status: DC | PRN

## 2018-12-24 NOTE — Addendum Note (Signed)
Addended by: Corwin Levins on: 12/24/2018 12:51 PM   Modules accepted: Orders

## 2018-12-24 NOTE — Telephone Encounter (Signed)
Copied from CRM [id number]. Topic: Quick Communication - Rx Refill/Question >> Dec 24, 2018  9:28 AM Maia Petties wrote: Medication: pt needing both Adderall prescriptions refills - has 2-3 days of each on hand 1- amphetamine-dextroamphetamine (ADDERALL XR) 20 MG 24 hr capsule 2- amphetamine-dextroamphetamine (ADDERALL) 20 MG tablet   Has the patient contacted their pharmacy? No - controlled Preferred Pharmacy (with phone number or street name): [facility] - [place], [place] - [address] [phone] (Phone) [phone] (Fax)

## 2018-12-24 NOTE — Telephone Encounter (Signed)
Done erxc

## 2019-01-20 ENCOUNTER — Other Ambulatory Visit: Payer: Self-pay | Admitting: Internal Medicine

## 2019-01-20 DIAGNOSIS — F900 Attention-deficit hyperactivity disorder, predominantly inattentive type: Secondary | ICD-10-CM

## 2019-01-20 MED ORDER — AMPHETAMINE-DEXTROAMPHETAMINE 20 MG PO TABS: 20.0000 mg | ORAL_TABLET | Freq: Every day | ORAL | 0 refills | Status: DC | PRN

## 2019-01-20 MED ORDER — AMPHETAMINE-DEXTROAMPHET ER 20 MG PO CP24: 20.0000 mg | ORAL_CAPSULE | Freq: Every day | ORAL | 0 refills | Status: DC

## 2019-01-20 NOTE — Telephone Encounter (Signed)
[place] Controlled Database Checked Last filled: 12/24/18 # 30 each LOV w/you: 09/18/18 Next appt w/you: None

## 2019-01-20 NOTE — Telephone Encounter (Signed)
Copied from [place] [id number]. Topic: Quick Communication - Rx Refill/Question >> Jan 20, 2019  9:43 AM Lionel December wrote: Medication:  amphetamine-dextroamphetamine (ADDERALL XR) 20 MG 24 hr capsule   amphetamine-dextroamphetamine (ADDERALL) 20 MG tablet     Has the patient contacted their pharmacy? Yes.   (Agent: If no, request that the patient contact the pharmacy for the refill.) (Agent: If yes, when and what did the pharmacy advise?)  Preferred Pharmacy (with phone number or street name): [place], [place] [phone] (Phone) [phone] (Fax)    Agent: Please be advised that RX refills may take up to 3 business days. We ask that you follow-up with your pharmacy.

## 2019-01-20 NOTE — Telephone Encounter (Signed)
Done erx 

## 2019-02-19 ENCOUNTER — Telehealth: Payer: Self-pay | Admitting: Internal Medicine

## 2019-02-19 DIAGNOSIS — F900 Attention-deficit hyperactivity disorder, predominantly inattentive type: Secondary | ICD-10-CM

## 2019-02-19 MED ORDER — AMPHETAMINE-DEXTROAMPHET ER 20 MG PO CP24: 20.0000 mg | ORAL_CAPSULE | Freq: Every day | ORAL | 0 refills | Status: DC

## 2019-02-19 MED ORDER — AMPHETAMINE-DEXTROAMPHETAMINE 20 MG PO TABS: 20.0000 mg | ORAL_TABLET | Freq: Every day | ORAL | 0 refills | Status: DC | PRN

## 2019-02-19 NOTE — Addendum Note (Signed)
Addended by: Biagio Borg on: 02/19/2019 12:06 PM   Modules accepted: Orders

## 2019-02-19 NOTE — Telephone Encounter (Signed)
Done erx 

## 2019-02-19 NOTE — Telephone Encounter (Signed)
Medication Refill - Medication: amphetamine-dextroamphetamine (ADDERALL XR) 20 MG 24 hr capsule/amphetamine-dextroamphetamine (ADDERALL) 20 MG tablet  Has the patient contacted their pharmacy? Yes.   (Agent: If no, request that the patient contact the pharmacy for the refill.) (Agent: If yes, when and what did the pharmacy advise?)  Preferred Pharmacy (with phone number or street name): [place], [place] [phone] (Phone) [phone] (Fax)     Agent: Please be advised that RX refills may take up to 3 business days. We ask that you follow-up with your pharmacy.

## 2019-03-20 ENCOUNTER — Telehealth: Payer: Self-pay | Admitting: Internal Medicine

## 2019-03-20 DIAGNOSIS — F900 Attention-deficit hyperactivity disorder, predominantly inattentive type: Secondary | ICD-10-CM

## 2019-03-20 MED ORDER — AMPHETAMINE-DEXTROAMPHET ER 20 MG PO CP24: 20.0000 mg | ORAL_CAPSULE | Freq: Every day | ORAL | 0 refills | Status: DC

## 2019-03-20 MED ORDER — AMPHETAMINE-DEXTROAMPHETAMINE 20 MG PO TABS: 20.0000 mg | ORAL_TABLET | Freq: Every day | ORAL | 0 refills | Status: DC | PRN

## 2019-03-20 NOTE — Telephone Encounter (Signed)
Done erx 

## 2019-03-20 NOTE — Telephone Encounter (Signed)
Copied from [place] [id number]. Topic: Quick Communication - Rx Refill/Question >> Mar 20, 2019  4:12 PM Leward Quan A wrote: Medication: amphetamine-dextroamphetamine (ADDERALL) 20 MG tablet, amphetamine-dextroamphetamine (ADDERALL XR) 20 MG 24 hr capsule   Has the patient contacted their pharmacy? Yes.   (Agent: If no, request that the patient contact the pharmacy for the refill.) (Agent: If yes, when and what did the pharmacy advise?)  Preferred Pharmacy (with phone number or street name): [place], [place] [phone] (Phone) [phone] (Fax)    Agent: Please be advised that RX refills may take up to 3 business days. We ask that you follow-up with your pharmacy.

## 2019-04-22 ENCOUNTER — Telehealth: Payer: Self-pay | Admitting: Internal Medicine

## 2019-04-22 DIAGNOSIS — F900 Attention-deficit hyperactivity disorder, predominantly inattentive type: Secondary | ICD-10-CM

## 2019-04-22 MED ORDER — AMPHETAMINE-DEXTROAMPHETAMINE 20 MG PO TABS: 20.0000 mg | ORAL_TABLET | Freq: Every day | ORAL | 0 refills | Status: DC | PRN

## 2019-04-22 MED ORDER — AMPHETAMINE-DEXTROAMPHET ER 20 MG PO CP24: 20.0000 mg | ORAL_CAPSULE | Freq: Every day | ORAL | 0 refills | Status: DC

## 2019-04-22 NOTE — Telephone Encounter (Signed)
Done erx 

## 2019-04-22 NOTE — Telephone Encounter (Signed)
Medication Refill: amphetamine-dextroamphetamine (ADDERALL XR) 20 MG 24 hr capsule [[id number]]   amphetamine-dextroamphetamine (ADDERALL) 20 MG tablet [[id number]]   Pharmacy:  [facility] [address] [place] [phone] (Phone) [phone] (Fax)

## 2019-04-22 NOTE — Addendum Note (Signed)
Addended by: Biagio Borg on: 04/22/2019 12:52 PM   Modules accepted: Orders

## 2019-05-19 ENCOUNTER — Other Ambulatory Visit: Payer: Self-pay | Admitting: Internal Medicine

## 2019-05-19 DIAGNOSIS — F900 Attention-deficit hyperactivity disorder, predominantly inattentive type: Secondary | ICD-10-CM

## 2019-05-19 MED ORDER — AMPHETAMINE-DEXTROAMPHETAMINE 20 MG PO TABS: 20.0000 mg | ORAL_TABLET | Freq: Every day | ORAL | 0 refills | Status: DC | PRN

## 2019-05-19 MED ORDER — AMPHETAMINE-DEXTROAMPHET ER 20 MG PO CP24: 20.0000 mg | ORAL_CAPSULE | Freq: Every day | ORAL | 0 refills | Status: DC

## 2019-05-19 NOTE — Telephone Encounter (Signed)
Done erx 

## 2019-05-19 NOTE — Telephone Encounter (Signed)
Requested medication (s) are due for refill today: yes  Requested medication (s) are on the active medication list: yes  Last refill:  04/22/2019  Future visit scheduled: no  Notes to clinic:  Refill cannot be delegated   Requested Prescriptions  Pending Prescriptions Disp Refills   amphetamine-dextroamphetamine (ADDERALL XR) 20 MG 24 hr capsule 30 capsule 0    Sig: Take 1 capsule (20 mg total) by mouth daily.     Not Delegated - Psychiatry:  Stimulants/ADHD Failed - 05/19/2019  8:45 AM      Failed - This refill cannot be delegated      Failed - Urine Drug Screen completed in last 360 days.      Failed - Valid encounter within last 3 months    Recent Outpatient Visits          8 months ago Wellness examination   [facility] [place] John, Hunt Oris, MD   1 year ago Attention deficit hyperactivity disorder (ADHD), predominantly inattentive type   [facility] John, James W, MD   2 years ago Attention deficit hyperactivity disorder (ADHD), predominantly inattentive type   [place], FNP   2 years ago Attention deficit hyperactivity disorder (ADHD), predominantly inattentive type   [place], Gregory D, FNP   2 years ago Attention deficit hyperactivity disorder (ADHD), predominantly inattentive type   [facility] -Rosita Fire, Ples Specter, FNP              amphetamine-dextroamphetamine (ADDERALL) 20 MG tablet 30 tablet 0    Sig: Take 1 tablet (20 mg total) by mouth daily as needed. Mid day prn only     Not Delegated - Psychiatry:  Stimulants/ADHD Failed - 05/19/2019  8:45 AM      Failed - This refill cannot be delegated      Failed - Urine Drug Screen completed in last 360 days.      Failed - Valid encounter within last 3 months    Recent Outpatient Visits          8 months ago Wellness examination   [facility] [place] John, James W, MD   1 year ago Attention deficit hyperactivity disorder (ADHD), predominantly inattentive type   [place] John, James W, MD   2 years ago Attention deficit hyperactivity disorder (ADHD), predominantly inattentive type   [place], FNP   2 years ago Attention deficit hyperactivity disorder (ADHD), predominantly inattentive type   [place], FNP   2 years ago Attention deficit hyperactivity disorder (ADHD), predominantly inattentive type   [place], FNP

## 2019-05-19 NOTE — Telephone Encounter (Signed)
Medication Refill - Medication:  amphetamine-dextroamphetamine (ADDERALL XR) 20 MG 24 hr capsule    amphetamine-dextroamphetamine (ADDERALL) 20 MG tablet    [facility]acy  [id number] [place], [place] [phone] (Phone) [phone] (Fax)    Pt was advised that RX refills may take up to 3 business days. We ask that you follow-up with your pharmacy.

## 2019-05-19 NOTE — Telephone Encounter (Signed)
Routing to CMA 

## 2019-06-16 ENCOUNTER — Other Ambulatory Visit: Payer: Self-pay | Admitting: Internal Medicine

## 2019-06-16 DIAGNOSIS — F900 Attention-deficit hyperactivity disorder, predominantly inattentive type: Secondary | ICD-10-CM

## 2019-06-16 MED ORDER — AMPHETAMINE-DEXTROAMPHET ER 20 MG PO CP24: 20.0000 mg | ORAL_CAPSULE | Freq: Every day | ORAL | 0 refills | Status: DC

## 2019-06-16 NOTE — Telephone Encounter (Signed)
Done erx 

## 2019-06-16 NOTE — Telephone Encounter (Signed)
Copied from [place] [id number]. Topic: Quick Communication - Rx Refill/Question >> Jun 16, 2019  8:56 AM Rainey Pines A wrote: Medication: amphetamine-dextroamphetamine (ADDERALL XR) 20 MG 24 hr capsule ,amphetamine-dextroamphetamine (ADDERALL) 20 MG tablet  Has the patient contacted their pharmacy? {Yes (Agent: If no, request that the patient contact the pharmacy for the refill.) (Agent: If yes, when and what did the pharmacy advise?)Contact PCP  Preferred Pharmacy (with phone number or street name): [id number] [place], [place] [phone] (Phone) [phone] (Fax)    Agent: Please be advised that RX refills may take up to 3 business days. We ask that you follow-up with your pharmacy.

## 2019-06-16 NOTE — Telephone Encounter (Signed)
Requested medication (s) are due for refill today: yes  Requested medication (s) are on the active medication list: yes  Last refill:  05/19/2019  Future visit scheduled: no  Notes to clinic:  Refill cannot be delegated   Requested Prescriptions  Pending Prescriptions Disp Refills   amphetamine-dextroamphetamine (ADDERALL XR) 20 MG 24 hr capsule 30 capsule 0    Sig: Take 1 capsule (20 mg total) by mouth daily.     Not Delegated - Psychiatry:  Stimulants/ADHD Failed - 06/16/2019  9:00 AM      Failed - This refill cannot be delegated      Failed - Urine Drug Screen completed in last 360 days.      Failed - Valid encounter within last 3 months    Recent Outpatient Visits          9 months ago Wellness examination   [facility] [place] John, Hunt Oris, MD   1 year ago Attention deficit hyperactivity disorder (ADHD), predominantly inattentive type   [place] John, James W, MD   2 years ago Attention deficit hyperactivity disorder (ADHD), predominantly inattentive type   [place], FNP   2 years ago Attention deficit hyperactivity disorder (ADHD), predominantly inattentive type   [place], FNP   2 years ago Attention deficit hyperactivity disorder (ADHD), predominantly inattentive type   [place], FNP

## 2019-06-16 NOTE — Telephone Encounter (Signed)
[place] Controlled Database Checked Last filled: 05/19/19 # 30 LOV w/you: 09/18/18 Next appt w/you: None

## 2019-07-14 ENCOUNTER — Telehealth: Payer: Self-pay | Admitting: Internal Medicine

## 2019-07-14 DIAGNOSIS — F900 Attention-deficit hyperactivity disorder, predominantly inattentive type: Secondary | ICD-10-CM

## 2019-07-14 MED ORDER — AMPHETAMINE-DEXTROAMPHET ER 20 MG PO CP24: 20.0000 mg | ORAL_CAPSULE | Freq: Every day | ORAL | 0 refills | Status: DC

## 2019-07-14 MED ORDER — AMPHETAMINE-DEXTROAMPHETAMINE 20 MG PO TABS: 20.0000 mg | ORAL_TABLET | Freq: Every day | ORAL | 0 refills | Status: DC | PRN

## 2019-07-14 NOTE — Telephone Encounter (Signed)
Medication Refill - Medication:   amphetamine-dextroamphetamine (ADDERALL XR) 20 MG 24 hr capsule    amphetamine-dextroamphetamine (ADDERALL) 20 MG tablet      Has the patient contacted their pharmacy? yes (Agent: If no, request that the patient contact the pharmacy for the refill.) (Agent: If yes, when and what did the pharmacy advise?)  Preferred Pharmacy (with phone number or street name):  [id number] [place], [place] [phone] (Phone) [phone] (Fax)   Agent: Please be advised that RX refills may take up to 3 business days. We ask that you follow-up with your pharmacy.

## 2019-07-14 NOTE — Telephone Encounter (Signed)
Both done erx 

## 2019-07-14 NOTE — Addendum Note (Signed)
Addended by: Biagio Borg on: 07/14/2019 05:36 PM   Modules accepted: Orders

## 2019-08-11 ENCOUNTER — Other Ambulatory Visit: Payer: Self-pay | Admitting: Internal Medicine

## 2019-08-11 DIAGNOSIS — F900 Attention-deficit hyperactivity disorder, predominantly inattentive type: Secondary | ICD-10-CM

## 2019-08-11 MED ORDER — AMPHETAMINE-DEXTROAMPHET ER 20 MG PO CP24: 20.0000 mg | ORAL_CAPSULE | Freq: Every day | ORAL | 0 refills | Status: DC

## 2019-08-11 MED ORDER — AMPHETAMINE-DEXTROAMPHETAMINE 20 MG PO TABS: 20.0000 mg | ORAL_TABLET | Freq: Every day | ORAL | 0 refills | Status: DC | PRN

## 2019-08-11 NOTE — Telephone Encounter (Signed)
Requested medication (s) are due for refill today: yes  Requested medication (s) are on the active medication list: yes  Last refill: 07/14/2019  Future visit scheduled:no  Notes to clinic:  This refill cannot be delegated    Requested Prescriptions  Pending Prescriptions Disp Refills   amphetamine-dextroamphetamine (ADDERALL XR) 20 MG 24 hr capsule 30 capsule 0    Sig: Take 1 capsule (20 mg total) by mouth daily.      Not Delegated - Psychiatry:  Stimulants/ADHD Failed - 08/11/2019  9:11 AM      Failed - This refill cannot be delegated      Failed - Urine Drug Screen completed in last 360 days.      Failed - Valid encounter within last 3 months    Recent Outpatient Visits           10 months ago Wellness examination   [place] John, Hunt Oris, MD   2 years ago Attention deficit hyperactivity disorder (ADHD), predominantly inattentive type   [place] John, James W, MD   2 years ago Attention deficit hyperactivity disorder (ADHD), predominantly inattentive type   [place], FNP   2 years ago Attention deficit hyperactivity disorder (ADHD), predominantly inattentive type   [place]ilda, Gregory D, FNP   2 years ago Attention deficit hyperactivity disorder (ADHD), predominantly inattentive type   [facility] Fire, Ples Specter, FNP                amphetamine-dextroamphetamine (ADDERALL) 20 MG tablet 30 tablet 0    Sig: Take 1 tablet (20 mg total) by mouth daily as needed. Mid day prn only      Not Delegated - Psychiatry:  Stimulants/ADHD Failed - 08/11/2019  9:11 AM      Failed - This refill cannot be delegated      Failed - Urine Drug Screen completed in last 360 days.      Failed - Valid encounter within last 3 months    Recent Outpatient Visits           10 months ago Wellness examination   [place] John, James W, MD   2 years ago Attention deficit hyperactivity disorder (ADHD), predominantly inattentive type   [place] John, James W, MD   2 years ago Attention deficit hyperactivity disorder (ADHD), predominantly inattentive type   [place], FNP   2 years ago Attention deficit hyperactivity disorder (ADHD), predominantly inattentive type   [place], FNP   2 years ago Attention deficit hyperactivity disorder (ADHD), predominantly inattentive type   [place], FNP

## 2019-08-11 NOTE — Telephone Encounter (Signed)
Both done erx 

## 2019-08-11 NOTE — Telephone Encounter (Signed)
Medication Refill - Medication: amphetamine-dextroamphetamine (ADDERALL XR) 20 MG 24 hr capsule amphetamine-dextroamphetamine (ADDERALL) 20 MG tablet    Preferred Pharmacy (with phone number or street name):  [id number] [address], [place] Phone:  [phone]  Fax:  [phone]       Agent: Please be advised that RX refills may take up to 3 business days. We ask that you follow-up with your pharmacy.

## 2019-09-09 ENCOUNTER — Telehealth: Payer: Self-pay

## 2019-09-09 DIAGNOSIS — F900 Attention-deficit hyperactivity disorder, predominantly inattentive type: Secondary | ICD-10-CM

## 2019-09-09 MED ORDER — AMPHETAMINE-DEXTROAMPHET ER 20 MG PO CP24: 20.0000 mg | ORAL_CAPSULE | Freq: Every day | ORAL | 0 refills | Status: DC

## 2019-09-09 MED ORDER — AMPHETAMINE-DEXTROAMPHETAMINE 20 MG PO TABS: 20.0000 mg | ORAL_TABLET | Freq: Every day | ORAL | 0 refills | Status: DC | PRN

## 2019-09-09 NOTE — Telephone Encounter (Signed)
Called pt no answer LMOM rx sen to [facility]of.Marland KitchenRaechel Chute

## 2019-09-09 NOTE — Telephone Encounter (Signed)
New message     1. Which medications need to be refilled? (please list name of each medication and dose if known)   amphetamine-dextroamphetamine (ADDERALL XR) 20 MG 24 hr capsule amphetamine-dextroamphetamine (ADDERALL) 20 MG tablet  2. Which pharmacy/location (including street and city if local pharmacy) is [facility] [address]   3. Do they need a 30 day or 90 day supply? 30 day supply

## 2019-09-09 NOTE — Telephone Encounter (Signed)
Done erx 

## 2019-10-06 ENCOUNTER — Telehealth: Payer: Self-pay | Admitting: Internal Medicine

## 2019-10-06 DIAGNOSIS — F900 Attention-deficit hyperactivity disorder, predominantly inattentive type: Secondary | ICD-10-CM

## 2019-10-06 MED ORDER — AMPHETAMINE-DEXTROAMPHETAMINE 20 MG PO TABS: 20.0000 mg | ORAL_TABLET | Freq: Every day | ORAL | 0 refills | Status: DC | PRN

## 2019-10-06 MED ORDER — AMPHETAMINE-DEXTROAMPHET ER 20 MG PO CP24: 20.0000 mg | ORAL_CAPSULE | Freq: Every day | ORAL | 0 refills | Status: DC

## 2019-10-06 NOTE — Telephone Encounter (Signed)
Done erx 

## 2019-10-06 NOTE — Telephone Encounter (Signed)
Medication Requested: amphetamine-dextroamphetamine (ADDERALL XR) 20 MG 24 hr capsule  amphetamine-dextroamphetamine (ADDERALL) 20 MG tablet  Is medication on med list: Yes (if no, inform pt they may need an appointment)  Is medication a controled: Yes (yes = last OV with PCP)  -Controlled Substances: Adderall, Ritalin, oxycodone, hydrocodone, methadone, alprazolam, etc  Last visit with PCP:   Is the OV > than 4 months: 09/18/18 (yes = schedule an appt if one is not already made)  Pharmacy (Name, Street, [place]): [phone] [place], [place] - [address]

## 2019-10-31 ENCOUNTER — Telehealth: Payer: Self-pay

## 2019-10-31 DIAGNOSIS — F900 Attention-deficit hyperactivity disorder, predominantly inattentive type: Secondary | ICD-10-CM

## 2019-10-31 MED ORDER — AMPHETAMINE-DEXTROAMPHETAMINE 20 MG PO TABS: 20.0000 mg | ORAL_TABLET | Freq: Every day | ORAL | 0 refills | Status: DC | PRN

## 2019-10-31 MED ORDER — AMPHETAMINE-DEXTROAMPHET ER 20 MG PO CP24: 20.0000 mg | ORAL_CAPSULE | Freq: Every day | ORAL | 0 refills | Status: DC

## 2019-10-31 NOTE — Telephone Encounter (Signed)
1.Medication Requested:  amphetamine-dextroamphetamine (ADDERALL XR) 20 MG 24 hr capsule  amphetamine-dextroamphetamine (ADDERALL) 20 MG tablet  2. Pharmacy (Name, Street, City):[phone] [place], [place] - [address]  3. On Med List: Yes    4. Last Visit with PCP: 2.5.20   5. Next visit date with PCP: 3.23.21    Agent: Please be advised that RX refills may take up to 3 business days. We ask that you follow-up with your pharmacy.

## 2019-11-04 ENCOUNTER — Ambulatory Visit (INDEPENDENT_AMBULATORY_CARE_PROVIDER_SITE_OTHER): Payer: Commercial Managed Care - PPO | Admitting: Internal Medicine

## 2019-11-04 ENCOUNTER — Other Ambulatory Visit: Payer: Self-pay

## 2019-11-04 ENCOUNTER — Encounter: Payer: Self-pay | Admitting: Internal Medicine

## 2019-11-04 VITALS — BP 124/78 | HR 95 | Temp 97.5°F | Ht 68.0 in | Wt 194.4 lb

## 2019-11-04 DIAGNOSIS — F900 Attention-deficit hyperactivity disorder, predominantly inattentive type: Secondary | ICD-10-CM

## 2019-11-04 DIAGNOSIS — Z0000 Encounter for general adult medical examination without abnormal findings: Secondary | ICD-10-CM | POA: Diagnosis not present

## 2019-11-04 DIAGNOSIS — Z23 Encounter for immunization: Secondary | ICD-10-CM | POA: Diagnosis not present

## 2019-11-04 LAB — CBC WITH DIFFERENTIAL/PLATELET
Basophils Absolute: 0 10*3/uL (ref 0.0–0.1)
Basophils Relative: 0.9 % (ref 0.0–3.0)
Eosinophils Absolute: 0.2 10*3/uL (ref 0.0–0.7)
Eosinophils Relative: 4.3 % (ref 0.0–5.0)
HCT: 41.5 % (ref 39.0–52.0)
Hemoglobin: 14.6 g/dL (ref 13.0–17.0)
Lymphocytes Relative: 38.2 % (ref 12.0–46.0)
Lymphs Abs: 2.1 10*3/uL (ref 0.7–4.0)
MCHC: 35.3 g/dL (ref 30.0–36.0)
MCV: 89.8 fl (ref 78.0–100.0)
Monocytes Absolute: 0.6 10*3/uL (ref 0.1–1.0)
Monocytes Relative: 10.2 % (ref 3.0–12.0)
Neutro Abs: 2.5 10*3/uL (ref 1.4–7.7)
Neutrophils Relative %: 46.4 % (ref 43.0–77.0)
Platelets: 254 10*3/uL (ref 150.0–400.0)
RBC: 4.62 Mil/uL (ref 4.22–5.81)
RDW: 13.3 % (ref 11.5–15.5)
WBC: 5.4 10*3/uL (ref 4.0–10.5)

## 2019-11-04 LAB — HEPATIC FUNCTION PANEL
ALT: 15 U/L (ref 0–53)
AST: 19 U/L (ref 0–37)
Albumin: 4.4 g/dL (ref 3.5–5.2)
Alkaline Phosphatase: 49 U/L (ref 39–117)
Bilirubin, Direct: 0.2 mg/dL (ref 0.0–0.3)
Total Bilirubin: 1 mg/dL (ref 0.2–1.2)
Total Protein: 6.9 g/dL (ref 6.0–8.3)

## 2019-11-04 LAB — BASIC METABOLIC PANEL
BUN: 13 mg/dL (ref 6–23)
CO2: 29 mEq/L (ref 19–32)
Calcium: 9.5 mg/dL (ref 8.4–10.5)
Chloride: 102 mEq/L (ref 96–112)
Creatinine, Ser: 0.77 mg/dL (ref 0.40–1.50)
GFR: 121.2 mL/min (ref >60.00)
Glucose, Bld: 88 mg/dL (ref 70–99)
Potassium: 4.5 mEq/L (ref 3.5–5.1)
Sodium: 136 mEq/L (ref 135–145)

## 2019-11-04 LAB — LIPID PANEL
Cholesterol: 119 mg/dL (ref 0–200)
HDL: 39.5 mg/dL (ref >39.00)
LDL Cholesterol: 46 mg/dL (ref 0–99)
NonHDL: 79.63
Total CHOL/HDL Ratio: 3
Triglycerides: 169 mg/dL — ABNORMAL HIGH (ref 0.0–149.0)
VLDL: 33.8 mg/dL (ref 0.0–40.0)

## 2019-11-04 LAB — TSH: TSH: 1.09 u[IU]/mL (ref 0.35–4.50)

## 2019-11-04 MED ORDER — AMPHETAMINE-DEXTROAMPHET ER 30 MG PO CP24: 30.0000 mg | ORAL_CAPSULE | ORAL | 0 refills | Status: DC

## 2019-11-04 MED ORDER — AMPHETAMINE-DEXTROAMPHETAMINE 20 MG PO TABS: 20.0000 mg | ORAL_TABLET | Freq: Every day | ORAL | 0 refills | Status: DC | PRN

## 2019-11-04 NOTE — Patient Instructions (Addendum)
You had the Tdap tetanus shot today  [facility] [address] [facility]  Ok to increase the adderal ER to 30 mg and continue the adderall 20 mg as needed at noon  Please continue all other medications as before, and refills have been done if requested.  Please have the pharmacy call with any other refills you may need.  Please continue your efforts at being more active, low cholesterol diet, and weight control.  You are otherwise up to date with prevention measures today.  Please keep your appointments with your specialists as you may have planned  Please go to the LAB at the blood drawing area for the tests to be done  You will be contacted by phone if any changes need to be made immediately.  Otherwise, you will receive a letter about your results with an explanation, but please check with MyChart first.  Please remember to sign up for MyChart if you have not done so, as this will be important to you in the future with finding out test results, communicating by private email, and scheduling acute appointments online when needed.  Please make an Appointment to return for your 1 year visit, or sooner if needed
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
## 2019-11-05 ENCOUNTER — Encounter: Payer: Self-pay | Admitting: Internal Medicine

## 2019-11-05 LAB — URINALYSIS, ROUTINE W REFLEX MICROSCOPIC
Bilirubin Urine: NEGATIVE
Hgb urine dipstick: NEGATIVE
Ketones, ur: NEGATIVE
Leukocytes,Ua: NEGATIVE
Nitrite: NEGATIVE
RBC / HPF: NONE SEEN (ref 0–?)
Specific Gravity, Urine: 1.02 (ref 1.000–1.030)
Total Protein, Urine: NEGATIVE
Urine Glucose: NEGATIVE
Urobilinogen, UA: 1 (ref 0.0–1.0)
pH: 7 (ref 5.0–8.0)

## 2019-11-05 NOTE — Assessment & Plan Note (Signed)
For med asd,  to f/u any worsening symptoms or concerns

## 2019-11-05 NOTE — Assessment & Plan Note (Signed)

## 2019-12-01 ENCOUNTER — Telehealth: Payer: Self-pay

## 2019-12-01 DIAGNOSIS — F900 Attention-deficit hyperactivity disorder, predominantly inattentive type: Secondary | ICD-10-CM

## 2019-12-01 MED ORDER — AMPHETAMINE-DEXTROAMPHET ER 30 MG PO CP24: 30.0000 mg | ORAL_CAPSULE | ORAL | 0 refills | Status: DC

## 2019-12-01 MED ORDER — AMPHETAMINE-DEXTROAMPHETAMINE 20 MG PO TABS: 20.0000 mg | ORAL_TABLET | Freq: Every day | ORAL | 0 refills | Status: DC | PRN

## 2019-12-01 NOTE — Telephone Encounter (Signed)
Check [place] registry last filled 11/04/2019../lmb  

## 2019-12-01 NOTE — Telephone Encounter (Signed)
1.Medication Requested: amphetamine-dextroamphetamine (ADDERALL XR) 30 MG 24 hr capsule amphetamine-dextroamphetamine (ADDERALL) 20 MG tablet   2. Pharmacy (Name, Street, [place]):[facility] - [place], [place] - [address]  3. On Med List: Yes   4. Last Visit with PCP: 3.23.21   5. Next visit date with PCP: no appt is made at this time    Agent: Please be advised that RX refills may take up to 3 business days. We ask that you follow-up with your pharmacy.

## 2019-12-01 NOTE — Telephone Encounter (Signed)
Done erx 

## 2019-12-04 ENCOUNTER — Telehealth: Payer: Self-pay

## 2019-12-04 NOTE — Telephone Encounter (Signed)
New message    The patient call needs the CMA to call him to discuss both medications.   The patient is aware both medications was called into [facility]  amphetamine-dextroamphetamine (ADDERALL XR) 30 MG 24 hr capsule amphetamine-dextroamphetamine (ADDERALL) 20 MG tablet

## 2019-12-08 NOTE — Telephone Encounter (Signed)
Spoke with pt and clarified questions. Sent note over to dr. Jonny Ruiz about Rx change due to med cost.

## 2019-12-08 NOTE — Telephone Encounter (Signed)
I am very confused, since the rx for the adderal er 30 was not sent as "DAW" (dispense as written) in the first place, and even if it was, the pt has the legal right to ask for generic at any time  I dont see the reason therefore to send again, unless there is a further issue not yet clarified in this messaging

## 2020-01-01 ENCOUNTER — Telehealth: Payer: Self-pay | Admitting: Internal Medicine

## 2020-01-01 DIAGNOSIS — F900 Attention-deficit hyperactivity disorder, predominantly inattentive type: Secondary | ICD-10-CM

## 2020-01-01 NOTE — Telephone Encounter (Signed)
   1.Medication Requested:amphetamine-dextroamphetamine (ADDERALL XR) 30 MG 24 hr capsule amphetamine-dextroamphetamine (ADDERALL) 20 MG tablet  2. Pharmacy (Name, Street, [place]):[facility] - [place], [place] - [address]  3. On Med List: yes 4. Last Visit with PCP: 11/04/19  5. Next visit date with PCP:   Agent: Please be advised that RX refills may take up to 3 business days. We ask that you follow-up with your pharmacy.

## 2020-01-02 MED ORDER — AMPHETAMINE-DEXTROAMPHET ER 30 MG PO CP24: 30.0000 mg | ORAL_CAPSULE | ORAL | 0 refills | Status: DC

## 2020-01-02 MED ORDER — AMPHETAMINE-DEXTROAMPHETAMINE 20 MG PO TABS: 20.0000 mg | ORAL_TABLET | Freq: Every day | ORAL | 0 refills | Status: DC | PRN

## 2020-01-02 NOTE — Telephone Encounter (Signed)
Done erx 

## 2020-02-02 ENCOUNTER — Telehealth: Payer: Self-pay

## 2020-02-02 DIAGNOSIS — F900 Attention-deficit hyperactivity disorder, predominantly inattentive type: Secondary | ICD-10-CM

## 2020-02-02 NOTE — Telephone Encounter (Signed)
1.Medication Requested:amphetamine-dextroamphetamine (ADDERALL XR) 30 MG 24 hr capsule amphetamine-dextroamphetamine (ADDERALL) 20 MG tablet  2. Pharmacy (Name, Street, [place]):[facility] - [place], [place] - [address]  3. On Med List: Yes   4. Last Visit with PCP: 3.23.21  5. Next visit date with PCP: n/a   Agent: Please be advised that RX refills may take up to 3 business days. We ask that you follow-up with your pharmacy.

## 2020-02-03 MED ORDER — AMPHETAMINE-DEXTROAMPHETAMINE 20 MG PO TABS: 20.0000 mg | ORAL_TABLET | Freq: Every day | ORAL | 0 refills | Status: DC | PRN

## 2020-02-03 MED ORDER — AMPHETAMINE-DEXTROAMPHET ER 30 MG PO CP24: 30.0000 mg | ORAL_CAPSULE | ORAL | 0 refills | Status: DC

## 2020-02-03 NOTE — Telephone Encounter (Signed)
Sent to Dr. John. 

## 2020-02-03 NOTE — Telephone Encounter (Signed)
Done erx 

## 2020-02-24 ENCOUNTER — Encounter: Payer: Self-pay | Admitting: Internal Medicine

## 2020-02-24 ENCOUNTER — Ambulatory Visit (INDEPENDENT_AMBULATORY_CARE_PROVIDER_SITE_OTHER): Payer: Commercial Managed Care - PPO | Admitting: Internal Medicine

## 2020-02-24 ENCOUNTER — Other Ambulatory Visit: Payer: Self-pay

## 2020-02-24 DIAGNOSIS — H9192 Unspecified hearing loss, left ear: Secondary | ICD-10-CM | POA: Insufficient documentation

## 2020-02-24 NOTE — Patient Instructions (Addendum)
Your left ear was irrigated of wax today  Please continue all other medications as before, and refills have been done if requested.  Please have the pharmacy call with any other refills you may need.  Please continue your efforts at being more active, low cholesterol diet, and weight control.  Please keep your appointments with your specialists as you may have planned    
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
## 2020-02-24 NOTE — Assessment & Plan Note (Addendum)
Resolved with wax impaction irrigation,  to f/u any worsening symptoms or concerns  I spent 21 minutes in preparing to see the patient by review of recent labs, imaging and procedures, obtaining and reviewing separately obtained history, communicating with the patient and family or caregiver, ordering medications, tests or procedures, and documenting clinical information in the EHR including the differential Dx, treatment, and any further evaluation and other management of left hearing loss

## 2020-03-01 ENCOUNTER — Telehealth: Payer: Self-pay | Admitting: Internal Medicine

## 2020-03-01 DIAGNOSIS — F900 Attention-deficit hyperactivity disorder, predominantly inattentive type: Secondary | ICD-10-CM

## 2020-03-01 NOTE — Telephone Encounter (Signed)
   1.Medication Requested: amphetamine-dextroamphetamine (ADDERALL XR) 30 MG 24 hr capsule amphetamine-dextroamphetamine (ADDERALL) 20 MG tablet  2. Pharmacy (Name, Street, [place]):[facility] - [place], [place] - [address]  3. On Med List: yes  4. Last Visit with PCP:  02/24/20  5. Next visit date with PCP: n/a   Agent: Please be advised that RX refills may take up to 3 business days. We ask that you follow-up with your pharmacy.

## 2020-03-02 MED ORDER — AMPHETAMINE-DEXTROAMPHETAMINE 20 MG PO TABS: 20.0000 mg | ORAL_TABLET | Freq: Every day | ORAL | 0 refills | Status: DC | PRN

## 2020-03-02 MED ORDER — AMPHETAMINE-DEXTROAMPHET ER 30 MG PO CP24: 30.0000 mg | ORAL_CAPSULE | ORAL | 0 refills | Status: DC

## 2020-03-02 NOTE — Telephone Encounter (Signed)
Sent to Dr. John. 

## 2020-03-02 NOTE — Telephone Encounter (Signed)
Done erx 

## 2020-04-02 ENCOUNTER — Telehealth: Payer: Self-pay

## 2020-04-02 DIAGNOSIS — F900 Attention-deficit hyperactivity disorder, predominantly inattentive type: Secondary | ICD-10-CM

## 2020-04-02 MED ORDER — AMPHETAMINE-DEXTROAMPHETAMINE 20 MG PO TABS: 20.0000 mg | ORAL_TABLET | Freq: Every day | ORAL | 0 refills | Status: DC | PRN

## 2020-04-02 MED ORDER — AMPHETAMINE-DEXTROAMPHET ER 30 MG PO CP24: 30.0000 mg | ORAL_CAPSULE | ORAL | 0 refills | Status: DC

## 2020-04-02 NOTE — Telephone Encounter (Signed)
Sent to Dr. John. 

## 2020-04-02 NOTE — Telephone Encounter (Signed)
1.Medication Requested:  amphetamine-dextroamphetamine (ADDERALL XR) 30 MG 24 hr capsule amphetamine-dextroamphetamine (ADDERALL) 20 MG tablet  2. Pharmacy (Name, Street, [place]):[facility] - [place], [place] - [address]  3. On Med List: Yes   4. Last Visit with PCP: 7.13.21   5. Next visit date with PCP: n/a   Agent: Please be advised that RX refills may take up to 3 business days. We ask that you follow-up with your pharmacy.

## 2020-04-02 NOTE — Telephone Encounter (Signed)
Done erx 

## 2020-04-30 ENCOUNTER — Telehealth: Payer: Self-pay | Admitting: Internal Medicine

## 2020-04-30 DIAGNOSIS — F900 Attention-deficit hyperactivity disorder, predominantly inattentive type: Secondary | ICD-10-CM

## 2020-04-30 NOTE — Telephone Encounter (Signed)
Requesting refill on the following medication and to be sent to pharmacy on file.  amphetamine-dextroamphetamine (ADDERALL XR) 30 MG 24 hr capsule  amphetamine-dextroamphetamine (ADDERALL) 20 MG tablet

## 2020-05-03 MED ORDER — AMPHETAMINE-DEXTROAMPHET ER 30 MG PO CP24
30.0000 mg | ORAL_CAPSULE | ORAL | 0 refills | Status: DC
Start: 2020-05-03 — End: 2020-05-29

## 2020-05-03 MED ORDER — AMPHETAMINE-DEXTROAMPHETAMINE 20 MG PO TABS: 20.0000 mg | ORAL_TABLET | Freq: Every day | ORAL | 0 refills | Status: DC | PRN

## 2020-05-03 NOTE — Telephone Encounter (Signed)
Sent to Dr. Burns 

## 2020-05-28 ENCOUNTER — Telehealth: Payer: Self-pay | Admitting: Internal Medicine

## 2020-05-28 DIAGNOSIS — F900 Attention-deficit hyperactivity disorder, predominantly inattentive type: Secondary | ICD-10-CM

## 2020-05-28 NOTE — Telephone Encounter (Signed)
amphetamine-dextroamphetamine (ADDERALL XR) 30 MG 24 hr capsule amphetamine-dextroamphetamine (ADDERALL) 20 MG tablet  2. Pharmacy (Name, Street, [place]):[facility] - [place], [place] - [address]  3. On Med List: Yes   4. Last Visit with PCP: 7.13.21  Next apt-N/A

## 2020-05-28 NOTE — Telephone Encounter (Signed)
Sent to Dr. John. 

## 2020-05-29 MED ORDER — AMPHETAMINE-DEXTROAMPHETAMINE 20 MG PO TABS: 20.0000 mg | ORAL_TABLET | Freq: Every day | ORAL | 0 refills | Status: DC | PRN

## 2020-05-29 MED ORDER — AMPHETAMINE-DEXTROAMPHET ER 30 MG PO CP24
30.0000 mg | ORAL_CAPSULE | ORAL | 0 refills | Status: DC
Start: 2020-05-29 — End: 2020-06-28

## 2020-05-29 NOTE — Telephone Encounter (Signed)
Done erx 

## 2020-06-28 ENCOUNTER — Telehealth: Payer: Self-pay | Admitting: Internal Medicine

## 2020-06-28 DIAGNOSIS — F900 Attention-deficit hyperactivity disorder, predominantly inattentive type: Secondary | ICD-10-CM

## 2020-06-28 MED ORDER — AMPHETAMINE-DEXTROAMPHET ER 30 MG PO CP24
30.0000 mg | ORAL_CAPSULE | ORAL | 0 refills | Status: DC
Start: 2020-06-28 — End: 2020-07-26

## 2020-06-28 MED ORDER — AMPHETAMINE-DEXTROAMPHETAMINE 20 MG PO TABS: 20.0000 mg | ORAL_TABLET | Freq: Every day | ORAL | 0 refills | Status: DC | PRN

## 2020-06-28 NOTE — Telephone Encounter (Signed)
Sent to Dr. John. 

## 2020-06-28 NOTE — Telephone Encounter (Signed)
amphetamine-dextroamphetamine (ADDERALL XR) 30 MG 24 hr capsule amphetamine-dextroamphetamine (ADDERALL) 20 MG tablet [facility] [place], [place] - [address] Phone:  [phone]  Fax:  [phone]     Requesting a refill

## 2020-07-26 ENCOUNTER — Telehealth: Payer: Self-pay | Admitting: Internal Medicine

## 2020-07-26 DIAGNOSIS — F900 Attention-deficit hyperactivity disorder, predominantly inattentive type: Secondary | ICD-10-CM

## 2020-07-26 MED ORDER — AMPHETAMINE-DEXTROAMPHETAMINE 20 MG PO TABS: 20.0000 mg | ORAL_TABLET | Freq: Every day | ORAL | 0 refills | Status: DC | PRN

## 2020-07-26 MED ORDER — AMPHETAMINE-DEXTROAMPHET ER 30 MG PO CP24
30.0000 mg | ORAL_CAPSULE | ORAL | 0 refills | Status: DC
Start: 2020-07-26 — End: 2020-08-23

## 2020-07-26 NOTE — Telephone Encounter (Signed)
Done erx 

## 2020-07-26 NOTE — Telephone Encounter (Signed)
    1.Medication Requested:  amphetamine-dextroamphetamine (ADDERALL XR) 30 MG 24 hr capsule  amphetamine-dextroamphetamine (ADDERALL) 20 MG tablet  2. Pharmacy (Name, Street, [place]): [facility] - [place], [place] - [address]  3. On Med List: yes   4. Last Visit with PCP: 02/24/20  5. Next visit date with PCP: n/a   Agent: Please be advised that RX refills may take up to 3 business days. We ask that you follow-up with your pharmacy.

## 2020-08-23 ENCOUNTER — Telehealth: Payer: Self-pay | Admitting: Internal Medicine

## 2020-08-23 DIAGNOSIS — F900 Attention-deficit hyperactivity disorder, predominantly inattentive type: Secondary | ICD-10-CM

## 2020-08-23 MED ORDER — AMPHETAMINE-DEXTROAMPHETAMINE 20 MG PO TABS: 20.0000 mg | ORAL_TABLET | Freq: Every day | ORAL | 0 refills | Status: DC | PRN

## 2020-08-23 MED ORDER — AMPHETAMINE-DEXTROAMPHET ER 30 MG PO CP24
30.0000 mg | ORAL_CAPSULE | ORAL | 0 refills | Status: DC
Start: 2020-08-23 — End: 2020-09-27

## 2020-08-23 NOTE — Telephone Encounter (Signed)
  1.Medication Requested:  amphetamine-dextroamphetamine (ADDERALL XR) 30 MG 24 hr capsule    amphetamine-dextroamphetamine (ADDERALL) 20 MG tablet    2. Pharmacy (Name, Street, [place]): [facility] - [place], [place] - [address]    3. On Med List: yes    4. Last Visit with PCP: 02/24/20    5. Next visit date with PCP: n/a   Patient also needs prior authorization he got new insurance.

## 2020-08-24 NOTE — Telephone Encounter (Signed)
Please advise.   Thanks. Dm/cma  

## 2020-08-24 NOTE — Telephone Encounter (Signed)
Already done jan 10

## 2020-09-27 ENCOUNTER — Telehealth: Payer: Self-pay | Admitting: Internal Medicine

## 2020-09-27 DIAGNOSIS — F900 Attention-deficit hyperactivity disorder, predominantly inattentive type: Secondary | ICD-10-CM

## 2020-09-27 MED ORDER — AMPHETAMINE-DEXTROAMPHET ER 30 MG PO CP24
30.0000 mg | ORAL_CAPSULE | ORAL | 0 refills | Status: DC
Start: 2020-09-27 — End: 2020-10-30

## 2020-09-27 MED ORDER — AMPHETAMINE-DEXTROAMPHETAMINE 20 MG PO TABS: 20.0000 mg | ORAL_TABLET | Freq: Every day | ORAL | 0 refills | Status: DC | PRN

## 2020-09-27 NOTE — Telephone Encounter (Signed)
1.Medication Requested:  amphetamine-dextroamphetamine (ADDERALL XR) 30 MG 24 hr capsule   amphetamine-dextroamphetamine (ADDERALL) 20 MG tablet   2. Pharmacy (Name, Street, [place]): [facility] - [place], [place] - [address]  3. On Med List: yes   4. Last Visit with PCP: 7.13.21  5. Next visit date with PCP: n/a    Agent: Please be advised that RX refills may take up to 3 business days. We ask that you follow-up with your pharmacy.

## 2020-09-27 NOTE — Addendum Note (Signed)
Addended by: Corwin Levins on: 09/27/2020 06:01 PM   Modules accepted: Orders

## 2020-09-28 NOTE — Telephone Encounter (Signed)
Patient states they need a prior auth to fill med

## 2020-10-26 ENCOUNTER — Telehealth: Payer: Self-pay | Admitting: Internal Medicine

## 2020-10-26 DIAGNOSIS — F900 Attention-deficit hyperactivity disorder, predominantly inattentive type: Secondary | ICD-10-CM

## 2020-10-26 NOTE — Telephone Encounter (Signed)
Patient requesting refill for amphetamine-dextroamphetamine (ADDERALL XR) 30 MG 24 hr capsule  amphetamine-dextroamphetamine (ADDERALL) 20 MG tablet   Pharmacy [facility] - [place], [place] - [address]

## 2020-10-30 MED ORDER — AMPHETAMINE-DEXTROAMPHET ER 30 MG PO CP24: 30.0000 mg | ORAL_CAPSULE | ORAL | 0 refills | Status: DC

## 2020-10-30 MED ORDER — AMPHETAMINE-DEXTROAMPHETAMINE 20 MG PO TABS: 20.0000 mg | ORAL_TABLET | Freq: Every day | ORAL | 0 refills | Status: DC | PRN

## 2020-11-30 ENCOUNTER — Telehealth: Payer: Self-pay | Admitting: Internal Medicine

## 2020-11-30 DIAGNOSIS — F900 Attention-deficit hyperactivity disorder, predominantly inattentive type: Secondary | ICD-10-CM

## 2020-11-30 MED ORDER — AMPHETAMINE-DEXTROAMPHETAMINE 20 MG PO TABS: 20.0000 mg | ORAL_TABLET | Freq: Every day | ORAL | 0 refills | Status: DC | PRN

## 2020-11-30 MED ORDER — AMPHETAMINE-DEXTROAMPHET ER 30 MG PO CP24: 30.0000 mg | ORAL_CAPSULE | ORAL | 0 refills | Status: DC

## 2020-11-30 NOTE — Telephone Encounter (Signed)
PMP done; please advise

## 2020-11-30 NOTE — Telephone Encounter (Signed)
1.Medication Requested:amphetamine-dextroamphetamine (ADDERALL XR) 30 MG 24 hr capsule amphetamine-dextroamphetamine (ADDERALL) 20 MG tablet  2. Pharmacy (Name, Street, [place]): [facility] - [place], [place] - [address]  3. On Med List: both, yes   4. Last Visit with PCP: 7.13.21  5. Next visit date with PCP: n/a   Agent: Please be advised that RX refills may take up to 3 business days. We ask that you follow-up with your pharmacy.

## 2020-12-29 ENCOUNTER — Other Ambulatory Visit: Payer: Self-pay | Admitting: Internal Medicine

## 2020-12-29 DIAGNOSIS — F900 Attention-deficit hyperactivity disorder, predominantly inattentive type: Secondary | ICD-10-CM

## 2020-12-29 NOTE — Telephone Encounter (Signed)
1.Medication Requested:amphetamine-dextroamphetamine (ADDERALL XR) 30 MG 24 hr capsule amphetamine-dextroamphetamine (ADDERALL) 20 MG tablet  2. Pharmacy (Name, Street, [place]): [facility] - , [place] - [address]  3. On Med List: both, yes   4. Last Visit with PCP: 7.13.21  5. Next visit date with PCP: n/a   Agent: Please be advised that RX refills may take up to 3 business days. We ask that you follow-up with your pharmacy.

## 2020-12-30 MED ORDER — AMPHETAMINE-DEXTROAMPHETAMINE 20 MG PO TABS
20.0000 mg | ORAL_TABLET | Freq: Every day | ORAL | 0 refills | Status: DC | PRN
Start: 2020-12-30 — End: 2021-02-04

## 2020-12-30 MED ORDER — AMPHETAMINE-DEXTROAMPHET ER 30 MG PO CP24: 30.0000 mg | ORAL_CAPSULE | ORAL | 0 refills | Status: DC

## 2020-12-30 NOTE — Telephone Encounter (Signed)
PMP done; last filled 11/30/20; please advise

## 2021-02-01 ENCOUNTER — Telehealth: Payer: Self-pay | Admitting: Internal Medicine

## 2021-02-01 DIAGNOSIS — F900 Attention-deficit hyperactivity disorder, predominantly inattentive type: Secondary | ICD-10-CM

## 2021-02-01 NOTE — Telephone Encounter (Signed)
1.Medication Requested: amphetamine-dextroamphetamine (ADDERALL XR) 30 MG 24 hr capsule  amphetamine-dextroamphetamine (ADDERALL) 20 MG tablet  2. Pharmacy (Name, Street, [place]): [facility] - [place], [place] - [address]  3. On Med List: yes   4. Last Visit with PCP: 02-24-20  5. Next visit date with PCP: n/a    Agent: Please be advised that RX refills may take up to 3 business days. We ask that you follow-up with your pharmacy.

## 2021-02-04 MED ORDER — AMPHETAMINE-DEXTROAMPHET ER 30 MG PO CP24: 30.0000 mg | ORAL_CAPSULE | ORAL | 0 refills | Status: DC

## 2021-02-04 MED ORDER — AMPHETAMINE-DEXTROAMPHETAMINE 20 MG PO TABS: 20.0000 mg | ORAL_TABLET | Freq: Every day | ORAL | 0 refills | Status: DC | PRN

## 2021-03-09 ENCOUNTER — Telehealth (INDEPENDENT_AMBULATORY_CARE_PROVIDER_SITE_OTHER): Payer: BC Managed Care – PPO | Admitting: Internal Medicine

## 2021-03-09 ENCOUNTER — Encounter: Payer: Self-pay | Admitting: Internal Medicine

## 2021-03-09 DIAGNOSIS — E559 Vitamin D deficiency, unspecified: Secondary | ICD-10-CM

## 2021-03-09 DIAGNOSIS — F900 Attention-deficit hyperactivity disorder, predominantly inattentive type: Secondary | ICD-10-CM

## 2021-03-09 DIAGNOSIS — E538 Deficiency of other specified B group vitamins: Secondary | ICD-10-CM

## 2021-03-09 DIAGNOSIS — Z0000 Encounter for general adult medical examination without abnormal findings: Secondary | ICD-10-CM | POA: Diagnosis not present

## 2021-03-09 DIAGNOSIS — Z1159 Encounter for screening for other viral diseases: Secondary | ICD-10-CM | POA: Diagnosis not present

## 2021-03-09 DIAGNOSIS — E7800 Pure hypercholesterolemia, unspecified: Secondary | ICD-10-CM

## 2021-03-09 MED ORDER — AMPHETAMINE-DEXTROAMPHETAMINE 20 MG PO TABS: 20.0000 mg | ORAL_TABLET | Freq: Every day | ORAL | 0 refills | Status: DC | PRN

## 2021-03-09 MED ORDER — AMPHETAMINE-DEXTROAMPHET ER 30 MG PO CP24: 30.0000 mg | ORAL_CAPSULE | ORAL | 0 refills | Status: DC

## 2021-03-09 NOTE — Assessment & Plan Note (Signed)
Lab Results  Component Value Date   LDLCALC 46 11/04/2019   Stable, pt to continue current low chol diet

## 2021-03-09 NOTE — Patient Instructions (Signed)
Please continue all other medications as before, and refills have been done if requested.  Please have the pharmacy call with any other refills you may need.  Please continue your efforts at being more active, low cholesterol diet, and weight control.  You are otherwise up to date with prevention measures today.  Please keep your appointments with your specialists as you may have planned  Please go to the LAB at the blood drawing area for the tests to be done - at the [address] lab site  You will be contacted by phone if any changes need to be made immediately.  Otherwise, you will receive a letter about your results with an explanation, but please check with MyChart first.  Please remember to sign up for MyChart if you have not done so, as this will be important to you in the future with finding out test results, communicating by private email, and scheduling acute appointments online when needed.  Please make an Appointment to return for your 1 year visit, or sooner if needed, with Lab testing by Appointment as well, to be done about 3-5 days before at the FIRST FLOOR Lab (so this is for TWO appointments - please see the scheduling desk as you leave)   Due to the ongoing Covid 19 pandemic, our lab now requires an appointment for any labs done at our office.  If you need labs done and do not have an appointment, please call our office ahead of time to schedule before presenting to the lab for your testing.

## 2021-03-09 NOTE — Assessment & Plan Note (Signed)
Age and sex appropriate education and counseling updated with regular exercise and diet Referrals for preventative services - due for hep c screen Immunizations addressed - declines covid Smoking counseling  - none needed Evidence for depression or other mood disorder - none significant Most recent labs reviewed. I have personally reviewed and have noted: 1) the patient's medical and social history 2) The patient's current medications and supplements 3) The patient's height, weight, and BMI have been recorded in the chart

## 2021-03-09 NOTE — Assessment & Plan Note (Signed)
Stable overall, success at work with stable job and 3 raises in the past yr, getting married soon, cont same med tx,  to f/u any worsening symptoms or concerns
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
## 2021-03-17 ENCOUNTER — Telehealth: Payer: Self-pay

## 2021-03-17 NOTE — Telephone Encounter (Signed)
PA for Amphetamine-Dextoamphetamine 20MG  TAB has been approved.  Key: 

## 2021-03-17 NOTE — Telephone Encounter (Signed)
PA for Amphetamine-Dextoamphetamine 30MG  Capsules was approved.   Key: 

## 2021-04-08 ENCOUNTER — Telehealth: Payer: Self-pay | Admitting: Internal Medicine

## 2021-04-08 DIAGNOSIS — F900 Attention-deficit hyperactivity disorder, predominantly inattentive type: Secondary | ICD-10-CM

## 2021-04-08 NOTE — Telephone Encounter (Signed)
1.Medication Requested: amphetamine-dextroamphetamine (ADDERALL XR) 30 MG 24 hr capsule amphetamine-dextroamphetamine (ADDERALL) 20 MG tablet   2. Pharmacy (Name, Street, [place]): [facility] - [place], [place] - [address]  Phone:  [phone] Fax:  [phone]   3. On Med List: yes  4. Last Visit with PCP: 07.27.22  5. Next visit date with PCP: n/a    Agent: Please be advised that RX refills may take up to 3 business days. We ask that you follow-up with your pharmacy.

## 2021-04-11 MED ORDER — AMPHETAMINE-DEXTROAMPHET ER 30 MG PO CP24: 30.0000 mg | ORAL_CAPSULE | ORAL | 0 refills | Status: DC

## 2021-04-11 MED ORDER — AMPHETAMINE-DEXTROAMPHETAMINE 20 MG PO TABS: 20.0000 mg | ORAL_TABLET | Freq: Every day | ORAL | 0 refills | Status: DC | PRN

## 2021-04-11 NOTE — Telephone Encounter (Signed)
Please advise; last filled 03/11/21

## 2021-04-11 NOTE — Telephone Encounter (Signed)
Patient calling to check status of refill request.

## 2021-05-12 ENCOUNTER — Emergency Department (HOSPITAL_COMMUNITY)
Admission: EM | Admit: 2021-05-12 | Discharge: 2021-05-13 | Disposition: A | Discharge status: Home / Self Care | Payer: BC Managed Care – PPO | Attending: Emergency Medicine | Admitting: Emergency Medicine

## 2021-05-12 DIAGNOSIS — T50901A Poisoning by unspecified drugs, medicaments and biological substances, accidental (unintentional), initial encounter: Secondary | ICD-10-CM | POA: Insufficient documentation

## 2021-05-12 DIAGNOSIS — R531 Weakness: Secondary | ICD-10-CM | POA: Insufficient documentation

## 2021-05-12 DIAGNOSIS — Z5321 Procedure and treatment not carried out due to patient leaving prior to being seen by health care provider: Secondary | ICD-10-CM | POA: Diagnosis not present

## 2021-05-12 DIAGNOSIS — R1013 Epigastric pain: Secondary | ICD-10-CM | POA: Insufficient documentation

## 2021-05-12 LAB — COMPREHENSIVE METABOLIC PANEL
ALT: 17 U/L (ref 0–44)
AST: 18 U/L (ref 15–41)
Albumin: 3.5 g/dL (ref 3.5–5.0)
Alkaline Phosphatase: 37 U/L — ABNORMAL LOW (ref 38–126)
Anion gap: 10 (ref 5–15)
BUN: 7 mg/dL (ref 6–20)
CO2: 21 mmol/L — ABNORMAL LOW (ref 22–32)
Calcium: 8.8 mg/dL — ABNORMAL LOW (ref 8.9–10.3)
Chloride: 99 mmol/L (ref 98–111)
Creatinine, Ser: 1.06 mg/dL (ref 0.61–1.24)
GFR, Estimated: >60 mL/min (ref >60)
Glucose, Bld: 139 mg/dL — ABNORMAL HIGH (ref 70–99)
Potassium: 2.9 mmol/L — ABNORMAL LOW (ref 3.5–5.1)
Sodium: 130 mmol/L — ABNORMAL LOW (ref 135–145)
Total Bilirubin: 1.4 mg/dL — ABNORMAL HIGH (ref 0.3–1.2)
Total Protein: 6.3 g/dL — ABNORMAL LOW (ref 6.5–8.1)

## 2021-05-12 LAB — CBC
HCT: 40.9 % (ref 39.0–52.0)
Hemoglobin: 14.6 g/dL (ref 13.0–17.0)
MCH: 31.1 pg (ref 26.0–34.0)
MCHC: 35.7 g/dL (ref 30.0–36.0)
MCV: 87.2 fL (ref 80.0–100.0)
Platelets: 221 10*3/uL (ref 150–400)
RBC: 4.69 MIL/uL (ref 4.22–5.81)
RDW: 12.1 % (ref 11.5–15.5)
WBC: 11.1 10*3/uL — ABNORMAL HIGH (ref 4.0–10.5)
nRBC: 0 % (ref 0.0–0.2)

## 2021-05-12 LAB — LIPASE, BLOOD: Lipase: 23 U/L (ref 11–51)

## 2021-05-12 LAB — ETHANOL: Alcohol, Ethyl (B): <10 mg/dL (ref <10)
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
## 2021-05-12 NOTE — ED Notes (Signed)
Pt left emergency department  

## 2021-05-12 NOTE — ED Triage Notes (Signed)
Pt here for overt drug use over the past 72 hours. Endorses doing GHB, percocet, xanax, meth to name a few. Pt states that he keep defecating himself and stating his "insides hurt" as well as headache noted. Pt endorsing epigastric abdominal pain. Patient states any time he stools, it's liquid.

## 2021-05-17 ENCOUNTER — Ambulatory Visit: Payer: BC Managed Care – PPO | Admitting: Internal Medicine

## 2021-05-17 ENCOUNTER — Encounter: Payer: Self-pay | Admitting: Internal Medicine

## 2021-05-17 ENCOUNTER — Other Ambulatory Visit: Payer: Self-pay | Admitting: Internal Medicine

## 2021-05-17 ENCOUNTER — Other Ambulatory Visit: Payer: Self-pay

## 2021-05-17 VITALS — BP 100/60 | HR 83 | Temp 98.1°F | Ht 68.0 in | Wt 165.2 lb

## 2021-05-17 DIAGNOSIS — K921 Melena: Secondary | ICD-10-CM

## 2021-05-17 DIAGNOSIS — Z0001 Encounter for general adult medical examination with abnormal findings: Secondary | ICD-10-CM | POA: Diagnosis not present

## 2021-05-17 DIAGNOSIS — Z1159 Encounter for screening for other viral diseases: Secondary | ICD-10-CM | POA: Diagnosis not present

## 2021-05-17 DIAGNOSIS — R112 Nausea with vomiting, unspecified: Secondary | ICD-10-CM

## 2021-05-17 DIAGNOSIS — F1910 Other psychoactive substance abuse, uncomplicated: Secondary | ICD-10-CM | POA: Diagnosis not present

## 2021-05-17 DIAGNOSIS — E559 Vitamin D deficiency, unspecified: Secondary | ICD-10-CM | POA: Diagnosis not present

## 2021-05-17 DIAGNOSIS — E538 Deficiency of other specified B group vitamins: Secondary | ICD-10-CM | POA: Diagnosis not present

## 2021-05-17 DIAGNOSIS — F900 Attention-deficit hyperactivity disorder, predominantly inattentive type: Secondary | ICD-10-CM

## 2021-05-17 LAB — HEPATIC FUNCTION PANEL
ALT: 12 U/L (ref 0–53)
AST: 12 U/L (ref 0–37)
Albumin: 3.4 g/dL — ABNORMAL LOW (ref 3.5–5.2)
Alkaline Phosphatase: 36 U/L — ABNORMAL LOW (ref 39–117)
Bilirubin, Direct: 0 mg/dL (ref 0.0–0.3)
Total Bilirubin: 0.5 mg/dL (ref 0.2–1.2)
Total Protein: 6.1 g/dL (ref 6.0–8.3)

## 2021-05-17 LAB — BASIC METABOLIC PANEL
BUN: 5 mg/dL — ABNORMAL LOW (ref 6–23)
CO2: 30 mEq/L (ref 19–32)
Calcium: 8.8 mg/dL (ref 8.4–10.5)
Chloride: 102 mEq/L (ref 96–112)
Creatinine, Ser: 0.63 mg/dL (ref 0.40–1.50)
GFR: 129.43 mL/min (ref >60.00)
Glucose, Bld: 89 mg/dL (ref 70–99)
Potassium: 3.3 mEq/L — ABNORMAL LOW (ref 3.5–5.1)
Sodium: 139 mEq/L (ref 135–145)

## 2021-05-17 LAB — URINALYSIS, ROUTINE W REFLEX MICROSCOPIC
Hgb urine dipstick: NEGATIVE
Ketones, ur: NEGATIVE
Leukocytes,Ua: NEGATIVE
Nitrite: NEGATIVE
RBC / HPF: NONE SEEN (ref 0–?)
Specific Gravity, Urine: 1.02 (ref 1.000–1.030)
Total Protein, Urine: NEGATIVE
Urine Glucose: NEGATIVE
Urobilinogen, UA: 0.2 (ref 0.0–1.0)
WBC, UA: NONE SEEN (ref 0–?)
pH: 6.5 (ref 5.0–8.0)

## 2021-05-17 LAB — CBC WITH DIFFERENTIAL/PLATELET
Basophils Absolute: 0 10*3/uL (ref 0.0–0.1)
Basophils Relative: 0.5 % (ref 0.0–3.0)
Eosinophils Absolute: 0.1 10*3/uL (ref 0.0–0.7)
Eosinophils Relative: 2.2 % (ref 0.0–5.0)
HCT: 37 % — ABNORMAL LOW (ref 39.0–52.0)
Hemoglobin: 12.8 g/dL — ABNORMAL LOW (ref 13.0–17.0)
Lymphocytes Relative: 26 % (ref 12.0–46.0)
Lymphs Abs: 1.4 10*3/uL (ref 0.7–4.0)
MCHC: 34.7 g/dL (ref 30.0–36.0)
MCV: 88.5 fl (ref 78.0–100.0)
Monocytes Absolute: 1.2 10*3/uL — ABNORMAL HIGH (ref 0.1–1.0)
Monocytes Relative: 22.8 % — ABNORMAL HIGH (ref 3.0–12.0)
Neutro Abs: 2.7 10*3/uL (ref 1.4–7.7)
Neutrophils Relative %: 48.5 % (ref 43.0–77.0)
Platelets: 267 10*3/uL (ref 150.0–400.0)
RBC: 4.19 Mil/uL — ABNORMAL LOW (ref 4.22–5.81)
RDW: 13.3 % (ref 11.5–15.5)
WBC: 5.5 10*3/uL (ref 4.0–10.5)

## 2021-05-17 LAB — LIPID PANEL
Cholesterol: 114 mg/dL (ref 0–200)
HDL: 30.1 mg/dL — ABNORMAL LOW (ref >39.00)
LDL Cholesterol: 68 mg/dL (ref 0–99)
NonHDL: 83.95
Total CHOL/HDL Ratio: 4
Triglycerides: 78 mg/dL (ref 0.0–149.0)
VLDL: 15.6 mg/dL (ref 0.0–40.0)

## 2021-05-17 LAB — TSH: TSH: 1.67 u[IU]/mL (ref 0.35–5.50)

## 2021-05-17 LAB — VITAMIN D 25 HYDROXY (VIT D DEFICIENCY, FRACTURES): VITD: 22.87 ng/mL — ABNORMAL LOW (ref 30.00–100.00)

## 2021-05-17 LAB — VITAMIN B12: Vitamin B-12: 398 pg/mL (ref 211–911)

## 2021-05-17 MED ORDER — AMPHETAMINE-DEXTROAMPHETAMINE 20 MG PO TABS: 20.0000 mg | ORAL_TABLET | Freq: Every day | ORAL | 0 refills | Status: DC | PRN

## 2021-05-17 MED ORDER — CHOLECALCIFEROL 50 MCG (2000 UT) PO TABS: ORAL_TABLET | ORAL | 99 refills | Status: AC

## 2021-05-17 MED ORDER — AMPHETAMINE-DEXTROAMPHET ER 30 MG PO CP24: 30.0000 mg | ORAL_CAPSULE | ORAL | 0 refills | Status: DC

## 2021-05-17 MED ORDER — HYDROCORTISONE ACETATE 25 MG RE SUPP: 25.0000 mg | Freq: Two times a day (BID) | RECTAL | 0 refills | Status: DC

## 2021-05-17 MED ORDER — POTASSIUM CHLORIDE ER 10 MEQ PO TBCR: 10.0000 meq | EXTENDED_RELEASE_TABLET | Freq: Every day | ORAL | 0 refills | Status: DC
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
## 2021-05-17 NOTE — Patient Instructions (Signed)
Please take all new medication as prescribed  You are given the work note today  Please continue all other medications as before, and refills have been done if requested.  Please have the pharmacy call with any other refills you may need.  Please continue your efforts at being more active, low cholesterol diet, and weight control.  You are otherwise up to date with prevention measures today.  Please keep your appointments with your specialists as you may have planned  Please go to the LAB at the blood drawing area for the tests to be done  You will be contacted by phone if any changes need to be made immediately.  Otherwise, you will receive a letter about your results with an explanation, but please check with MyChart first.  Please remember to sign up for MyChart if you have not done so, as this will be important to you in the future with finding out test results, communicating by private email, and scheduling acute appointments online when needed.  Please make an Appointment to return for your 1 year visit, or sooner if needed

## 2021-05-18 LAB — HEPATITIS C ANTIBODY
Hepatitis C Ab: NONREACTIVE
SIGNAL TO CUT-OFF: 0.01 (ref <1.00)

## 2021-05-21 ENCOUNTER — Encounter: Payer: Self-pay | Admitting: Internal Medicine

## 2021-05-21 DIAGNOSIS — E559 Vitamin D deficiency, unspecified: Secondary | ICD-10-CM | POA: Insufficient documentation

## 2021-05-21 DIAGNOSIS — F1910 Other psychoactive substance abuse, uncomplicated: Secondary | ICD-10-CM | POA: Insufficient documentation

## 2021-05-21 NOTE — Assessment & Plan Note (Addendum)
Resolved, for cbc, declines GI referral, for anusol supp prn,   to f/u any worsening symptoms or concerns

## 2021-05-21 NOTE — Assessment & Plan Note (Signed)
Resolved,  to f/u any worsening symptoms or concerns  

## 2021-05-21 NOTE — Assessment & Plan Note (Signed)
Last vitamin D Lab Results  Component Value Date   VD25OH 22.87 (L) 05/17/2021   Low, to start oral replacement

## 2021-05-21 NOTE — Assessment & Plan Note (Signed)
Overall stable, cont same adderall,  to f/u any worsening symptoms or concerns

## 2021-05-21 NOTE — Assessment & Plan Note (Signed)
Age and sex appropriate education and counseling updated with regular exercise and diet Referrals for preventative services - none needed Immunizations addressed - declines covid booster or flu shot Smoking counseling  - none needed Evidence for depression or other mood disorder - none significant Most recent labs reviewed. I have personally reviewed and have noted: 1) the patient's medical and social history 2) The patient's current medications and supplements 3) The patient's height, weight, and BMI have been recorded in the chart

## 2021-05-21 NOTE — Assessment & Plan Note (Signed)
Pt with episode multi-drug use unclaer etiology, states will never do this again, for urine drug screen

## 2021-05-23 LAB — AMPHETAMINE CONF, UR
Amphetamine GC/MS Conf: 2543 ng/mL
Amphetamine: POSITIVE — AB
Amphetamines: POSITIVE — AB
Methamphetamine: NEGATIVE

## 2021-05-23 LAB — PANEL 799049
CARBOXY THC GC/MS CONF: 17 ng/mL
Cannabinoid GC/MS, Ur: POSITIVE — AB

## 2021-05-23 LAB — URINE DRUGS OF ABUSE SCREEN W ALC, ROUTINE (REF LAB)
Barbiturate Quant, Ur: NEGATIVE ng/mL
Benzodiazepine Quant, Ur: NEGATIVE ng/mL
Cocaine (Metab.): NEGATIVE ng/mL
Ethanol, Urine: NEGATIVE %
Methadone Screen, Urine: NEGATIVE ng/mL
Opiate Quant, Ur: NEGATIVE ng/mL
PCP Quant, Ur: NEGATIVE ng/mL
Propoxyphene: NEGATIVE ng/mL

## 2021-06-22 ENCOUNTER — Telehealth: Payer: Self-pay | Admitting: Internal Medicine

## 2021-06-22 DIAGNOSIS — F900 Attention-deficit hyperactivity disorder, predominantly inattentive type: Secondary | ICD-10-CM

## 2021-06-22 MED ORDER — AMPHETAMINE-DEXTROAMPHET ER 30 MG PO CP24: 30.0000 mg | ORAL_CAPSULE | ORAL | 0 refills | Status: DC

## 2021-06-22 MED ORDER — AMPHETAMINE-DEXTROAMPHETAMINE 20 MG PO TABS: 20.0000 mg | ORAL_TABLET | Freq: Every day | ORAL | 0 refills | Status: DC | PRN

## 2021-06-22 NOTE — Telephone Encounter (Signed)
1.Medication Requested: amphetamine-dextroamphetamine (ADDERALL XR) 30 MG 24 hr capsule  amphetamine-dextroamphetamine (ADDERALL) 20 MG tablet   2. Pharmacy (Name, Street, [place]): [facility] - [place], [place] - [address]  Phone:  [phone] Fax:  [phone]   3. On Med List: yes  4. Last Visit with PCP: 10.04.22  5. Next visit date with PCP: n/a   Agent: Please be advised that RX refills may take up to 3 business days. We ask that you follow-up with your pharmacy.

## 2021-07-22 ENCOUNTER — Telehealth: Payer: Self-pay | Admitting: Internal Medicine

## 2021-07-22 DIAGNOSIS — F900 Attention-deficit hyperactivity disorder, predominantly inattentive type: Secondary | ICD-10-CM

## 2021-07-22 MED ORDER — AMPHETAMINE-DEXTROAMPHET ER 30 MG PO CP24
30.0000 mg | ORAL_CAPSULE | ORAL | 0 refills | Status: DC
Start: 2021-07-22 — End: 2021-08-19

## 2021-07-22 MED ORDER — AMPHETAMINE-DEXTROAMPHETAMINE 20 MG PO TABS: 20.0000 mg | ORAL_TABLET | Freq: Every day | ORAL | 0 refills | Status: DC | PRN

## 2021-07-22 NOTE — Addendum Note (Signed)
Addended by: Corwin Levins on: 07/22/2021 03:51 PM   Modules accepted: Orders

## 2021-07-22 NOTE — Telephone Encounter (Signed)
1.Medication Requested: amphetamine-dextroamphetamine (ADDERALL XR) 30 MG 24 hr capsule amphetamine-dextroamphetamine (ADDERALL) 20 MG tablet   2. Pharmacy (Name, Street, [place]): [facility] - [place], [place] - [address]  3. On Med List: yes  4. Last Visit with PCP: 05-17-2021  5. Next visit date with PCP: n/a   Agent: Please be advised that RX refills may take up to 3 business days. We ask that you follow-up with your pharmacy.

## 2021-07-26 MED ORDER — AMPHETAMINE-DEXTROAMPHETAMINE 20 MG PO TABS: 20.0000 mg | ORAL_TABLET | Freq: Every day | ORAL | 0 refills | Status: DC | PRN

## 2021-07-26 NOTE — Telephone Encounter (Signed)
Pt states please send rx for amphetamine-dextroamphetamine (ADDERALL) 20 MG tablet  to [facility] off [address] and [address] as his normal pharmacy on fill does not have the above meds in stock.  **I have spoken with Dalia at the [facility] to confirm that the above meds were not in stock before asking PCP to send over new rx to [facility] and she has confirmed that they do not have the medication in stock and does not know when it will be available.

## 2021-07-26 NOTE — Telephone Encounter (Signed)
Ok this is done 

## 2021-07-26 NOTE — Addendum Note (Signed)
Addended by: Corwin Levins on: 07/26/2021 12:45 PM   Modules accepted: Orders

## 2021-08-19 ENCOUNTER — Telehealth: Payer: Self-pay | Admitting: Internal Medicine

## 2021-08-19 DIAGNOSIS — F900 Attention-deficit hyperactivity disorder, predominantly inattentive type: Secondary | ICD-10-CM

## 2021-08-19 MED ORDER — AMPHETAMINE-DEXTROAMPHET ER 30 MG PO CP24
30.0000 mg | ORAL_CAPSULE | ORAL | 0 refills | Status: DC
Start: 2021-08-19 — End: 2021-08-22

## 2021-08-19 MED ORDER — AMPHETAMINE-DEXTROAMPHETAMINE 20 MG PO TABS: 20.0000 mg | ORAL_TABLET | Freq: Every day | ORAL | 0 refills | Status: DC | PRN

## 2021-08-19 NOTE — Telephone Encounter (Signed)
Done erx 

## 2021-08-19 NOTE — Telephone Encounter (Signed)
1.Medication Requested: amphetamine-dextroamphetamine (ADDERALL XR) 30 MG 24 hr capsule  amphetamine-dextroamphetamine (ADDERALL) 20 MG tablet   2. Pharmacy (Name, Street, [place]): [facility] - [place], [place] - [address]  Phone:  [phone] Fax:  [phone]   3. On Med List: yes  4. Last Visit with PCP: 10.04.22  5. Next visit date with PCP: n/a   Agent: Please be advised that RX refills may take up to 3 business days. We ask that you follow-up with your pharmacy.  

## 2021-08-22 MED ORDER — AMPHETAMINE-DEXTROAMPHETAMINE 20 MG PO TABS: 20.0000 mg | ORAL_TABLET | Freq: Every day | ORAL | 0 refills | Status: DC | PRN

## 2021-08-22 MED ORDER — AMPHETAMINE-DEXTROAMPHET ER 30 MG PO CP24: 30.0000 mg | ORAL_CAPSULE | ORAL | 0 refills | Status: DC

## 2021-08-22 NOTE — Telephone Encounter (Signed)
Patient calling in  [facility] says [facility] is currently OOS on meds  Needs new rxs for amphetamine-dextroamphetamine (ADDERALL XR) 30 MG 24 hr capsule amphetamine-dextroamphetamine (ADDERALL) 20 MG tablet sent to different pharmacy    [phone] [place], [place] - [address]  Phone:  [phone] Fax:  [phone]

## 2021-08-22 NOTE — Telephone Encounter (Signed)
Ok done erx 

## 2021-08-23 MED ORDER — AMPHETAMINE-DEXTROAMPHET ER 30 MG PO CP24: 30.0000 mg | ORAL_CAPSULE | ORAL | 0 refills | Status: DC

## 2021-08-23 NOTE — Telephone Encounter (Signed)
Ok done

## 2021-08-23 NOTE — Addendum Note (Signed)
Addended by: Corwin Levins on: 08/23/2021 03:07 PM   Modules accepted: Orders

## 2021-08-23 NOTE — Telephone Encounter (Signed)
Spoke with patient and he states [facility] regular [facility] in stock but not the XR. Patient will call around to different pharmacies to see which one has it in stock.

## 2021-08-23 NOTE — Telephone Encounter (Signed)
Patient requesting Adderall XR sent to [facility] [address] [place], [place] [address]  Please advise

## 2021-09-16 DIAGNOSIS — F1520 Other stimulant dependence, uncomplicated: Secondary | ICD-10-CM | POA: Diagnosis not present

## 2021-09-17 DIAGNOSIS — F1520 Other stimulant dependence, uncomplicated: Secondary | ICD-10-CM | POA: Diagnosis not present

## 2021-09-18 DIAGNOSIS — F1520 Other stimulant dependence, uncomplicated: Secondary | ICD-10-CM | POA: Diagnosis not present

## 2021-09-19 DIAGNOSIS — F1520 Other stimulant dependence, uncomplicated: Secondary | ICD-10-CM | POA: Diagnosis not present

## 2021-09-20 DIAGNOSIS — F1420 Cocaine dependence, uncomplicated: Secondary | ICD-10-CM | POA: Diagnosis not present

## 2021-09-20 DIAGNOSIS — F902 Attention-deficit hyperactivity disorder, combined type: Secondary | ICD-10-CM | POA: Diagnosis not present

## 2021-09-20 DIAGNOSIS — Z79899 Other long term (current) drug therapy: Secondary | ICD-10-CM | POA: Diagnosis not present

## 2021-09-20 DIAGNOSIS — F1320 Sedative, hypnotic or anxiolytic dependence, uncomplicated: Secondary | ICD-10-CM | POA: Diagnosis not present

## 2021-09-20 DIAGNOSIS — F1520 Other stimulant dependence, uncomplicated: Secondary | ICD-10-CM | POA: Diagnosis not present

## 2021-09-21 DIAGNOSIS — F1420 Cocaine dependence, uncomplicated: Secondary | ICD-10-CM | POA: Diagnosis not present

## 2021-09-21 DIAGNOSIS — F1520 Other stimulant dependence, uncomplicated: Secondary | ICD-10-CM | POA: Diagnosis not present

## 2021-09-22 DIAGNOSIS — F1520 Other stimulant dependence, uncomplicated: Secondary | ICD-10-CM | POA: Diagnosis not present

## 2021-09-22 DIAGNOSIS — F1420 Cocaine dependence, uncomplicated: Secondary | ICD-10-CM | POA: Diagnosis not present

## 2021-09-23 DIAGNOSIS — F1520 Other stimulant dependence, uncomplicated: Secondary | ICD-10-CM | POA: Diagnosis not present

## 2021-09-23 DIAGNOSIS — F1420 Cocaine dependence, uncomplicated: Secondary | ICD-10-CM | POA: Diagnosis not present

## 2021-09-24 DIAGNOSIS — F1520 Other stimulant dependence, uncomplicated: Secondary | ICD-10-CM | POA: Diagnosis not present

## 2021-09-24 DIAGNOSIS — F1420 Cocaine dependence, uncomplicated: Secondary | ICD-10-CM | POA: Diagnosis not present

## 2021-09-25 DIAGNOSIS — F1520 Other stimulant dependence, uncomplicated: Secondary | ICD-10-CM | POA: Diagnosis not present

## 2021-09-25 DIAGNOSIS — F1420 Cocaine dependence, uncomplicated: Secondary | ICD-10-CM | POA: Diagnosis not present

## 2021-09-26 DIAGNOSIS — F1420 Cocaine dependence, uncomplicated: Secondary | ICD-10-CM | POA: Diagnosis not present

## 2021-09-26 DIAGNOSIS — F1520 Other stimulant dependence, uncomplicated: Secondary | ICD-10-CM | POA: Diagnosis not present

## 2021-09-27 DIAGNOSIS — F1420 Cocaine dependence, uncomplicated: Secondary | ICD-10-CM | POA: Diagnosis not present

## 2021-09-27 DIAGNOSIS — F1520 Other stimulant dependence, uncomplicated: Secondary | ICD-10-CM | POA: Diagnosis not present

## 2021-09-28 DIAGNOSIS — F1520 Other stimulant dependence, uncomplicated: Secondary | ICD-10-CM | POA: Diagnosis not present

## 2021-09-28 DIAGNOSIS — F1420 Cocaine dependence, uncomplicated: Secondary | ICD-10-CM | POA: Diagnosis not present

## 2021-09-29 DIAGNOSIS — F1420 Cocaine dependence, uncomplicated: Secondary | ICD-10-CM | POA: Diagnosis not present

## 2021-09-29 DIAGNOSIS — F1520 Other stimulant dependence, uncomplicated: Secondary | ICD-10-CM | POA: Diagnosis not present

## 2021-09-30 DIAGNOSIS — F1520 Other stimulant dependence, uncomplicated: Secondary | ICD-10-CM | POA: Diagnosis not present

## 2021-09-30 DIAGNOSIS — F1420 Cocaine dependence, uncomplicated: Secondary | ICD-10-CM | POA: Diagnosis not present

## 2021-10-01 DIAGNOSIS — F1520 Other stimulant dependence, uncomplicated: Secondary | ICD-10-CM | POA: Diagnosis not present

## 2021-10-01 DIAGNOSIS — F1420 Cocaine dependence, uncomplicated: Secondary | ICD-10-CM | POA: Diagnosis not present

## 2021-10-02 DIAGNOSIS — F1520 Other stimulant dependence, uncomplicated: Secondary | ICD-10-CM | POA: Diagnosis not present

## 2021-10-02 DIAGNOSIS — F1420 Cocaine dependence, uncomplicated: Secondary | ICD-10-CM | POA: Diagnosis not present

## 2021-10-03 DIAGNOSIS — F1520 Other stimulant dependence, uncomplicated: Secondary | ICD-10-CM | POA: Diagnosis not present

## 2021-10-03 DIAGNOSIS — F1420 Cocaine dependence, uncomplicated: Secondary | ICD-10-CM | POA: Diagnosis not present

## 2021-10-04 DIAGNOSIS — F1420 Cocaine dependence, uncomplicated: Secondary | ICD-10-CM | POA: Diagnosis not present

## 2021-10-04 DIAGNOSIS — F1520 Other stimulant dependence, uncomplicated: Secondary | ICD-10-CM | POA: Diagnosis not present

## 2021-10-05 DIAGNOSIS — F902 Attention-deficit hyperactivity disorder, combined type: Secondary | ICD-10-CM | POA: Diagnosis not present

## 2021-10-05 DIAGNOSIS — F1420 Cocaine dependence, uncomplicated: Secondary | ICD-10-CM | POA: Diagnosis not present

## 2021-10-05 DIAGNOSIS — F1320 Sedative, hypnotic or anxiolytic dependence, uncomplicated: Secondary | ICD-10-CM | POA: Diagnosis not present

## 2021-10-05 DIAGNOSIS — F1520 Other stimulant dependence, uncomplicated: Secondary | ICD-10-CM | POA: Diagnosis not present

## 2021-10-05 DIAGNOSIS — Z79899 Other long term (current) drug therapy: Secondary | ICD-10-CM | POA: Diagnosis not present

## 2021-10-06 DIAGNOSIS — F1420 Cocaine dependence, uncomplicated: Secondary | ICD-10-CM | POA: Diagnosis not present

## 2021-10-06 DIAGNOSIS — F1520 Other stimulant dependence, uncomplicated: Secondary | ICD-10-CM | POA: Diagnosis not present

## 2021-10-07 DIAGNOSIS — F1420 Cocaine dependence, uncomplicated: Secondary | ICD-10-CM | POA: Diagnosis not present

## 2021-10-07 DIAGNOSIS — F1520 Other stimulant dependence, uncomplicated: Secondary | ICD-10-CM | POA: Diagnosis not present

## 2021-10-08 DIAGNOSIS — F1520 Other stimulant dependence, uncomplicated: Secondary | ICD-10-CM | POA: Diagnosis not present

## 2021-10-08 DIAGNOSIS — F1420 Cocaine dependence, uncomplicated: Secondary | ICD-10-CM | POA: Diagnosis not present

## 2021-10-09 DIAGNOSIS — F1520 Other stimulant dependence, uncomplicated: Secondary | ICD-10-CM | POA: Diagnosis not present

## 2021-10-09 DIAGNOSIS — F1420 Cocaine dependence, uncomplicated: Secondary | ICD-10-CM | POA: Diagnosis not present

## 2021-10-10 DIAGNOSIS — F1520 Other stimulant dependence, uncomplicated: Secondary | ICD-10-CM | POA: Diagnosis not present

## 2021-10-10 DIAGNOSIS — F1420 Cocaine dependence, uncomplicated: Secondary | ICD-10-CM | POA: Diagnosis not present

## 2021-10-11 DIAGNOSIS — F1520 Other stimulant dependence, uncomplicated: Secondary | ICD-10-CM | POA: Diagnosis not present

## 2021-10-11 DIAGNOSIS — F1420 Cocaine dependence, uncomplicated: Secondary | ICD-10-CM | POA: Diagnosis not present

## 2021-10-12 DIAGNOSIS — F1520 Other stimulant dependence, uncomplicated: Secondary | ICD-10-CM | POA: Diagnosis not present

## 2021-10-12 DIAGNOSIS — F1420 Cocaine dependence, uncomplicated: Secondary | ICD-10-CM | POA: Diagnosis not present

## 2021-10-13 DIAGNOSIS — F1520 Other stimulant dependence, uncomplicated: Secondary | ICD-10-CM | POA: Diagnosis not present

## 2021-10-13 DIAGNOSIS — F1420 Cocaine dependence, uncomplicated: Secondary | ICD-10-CM | POA: Diagnosis not present

## 2021-10-14 DIAGNOSIS — F1520 Other stimulant dependence, uncomplicated: Secondary | ICD-10-CM | POA: Diagnosis not present

## 2021-10-14 DIAGNOSIS — F1420 Cocaine dependence, uncomplicated: Secondary | ICD-10-CM | POA: Diagnosis not present

## 2021-10-15 DIAGNOSIS — F1420 Cocaine dependence, uncomplicated: Secondary | ICD-10-CM | POA: Diagnosis not present

## 2021-10-15 DIAGNOSIS — F1520 Other stimulant dependence, uncomplicated: Secondary | ICD-10-CM | POA: Diagnosis not present

## 2021-10-16 DIAGNOSIS — F1520 Other stimulant dependence, uncomplicated: Secondary | ICD-10-CM | POA: Diagnosis not present

## 2021-10-16 DIAGNOSIS — F1420 Cocaine dependence, uncomplicated: Secondary | ICD-10-CM | POA: Diagnosis not present

## 2021-10-17 DIAGNOSIS — F1520 Other stimulant dependence, uncomplicated: Secondary | ICD-10-CM | POA: Diagnosis not present

## 2021-10-17 DIAGNOSIS — F1420 Cocaine dependence, uncomplicated: Secondary | ICD-10-CM | POA: Diagnosis not present

## 2021-10-19 MED ORDER — AMPHETAMINE-DEXTROAMPHETAMINE 20 MG PO TABS: 20.0000 mg | ORAL_TABLET | Freq: Every day | ORAL | 0 refills | Status: DC | PRN

## 2021-10-19 MED ORDER — AMPHETAMINE-DEXTROAMPHET ER 30 MG PO CP24: 30.0000 mg | ORAL_CAPSULE | ORAL | 0 refills | Status: DC

## 2021-10-19 NOTE — Addendum Note (Signed)
Addended by: Biagio Borg on: 10/19/2021 12:50 PM ? ? Modules accepted: Orders ? ?

## 2021-10-19 NOTE — Telephone Encounter (Signed)
1.Medication Requested: amphetamine-dextroamphetamine (ADDERALL XR) 30 MG 24 hr capsule ?amphetamine-dextroamphetamine (ADDERALL) 20 MG tablet ? ? ?2. Pharmacy (Name, Street, [place]): [facility] - [place], [place] - [address]  ?Phone:  [phone] ?Fax:  [phone] ? ? ?3. On Med List: yes ? ?4. Last Visit with PCP: 10.04.22 ? ?5. Next visit date with PCP: 03.27.23 ? ? ?Agent: Please be advised that RX refills may take up to 3 business days. We ask that you follow-up with your pharmacy.  ?

## 2021-11-07 ENCOUNTER — Ambulatory Visit (INDEPENDENT_AMBULATORY_CARE_PROVIDER_SITE_OTHER): Payer: BC Managed Care – PPO | Admitting: Internal Medicine

## 2021-11-07 ENCOUNTER — Encounter: Payer: Self-pay | Admitting: Internal Medicine

## 2021-11-07 ENCOUNTER — Other Ambulatory Visit: Payer: Self-pay

## 2021-11-07 VITALS — BP 120/86 | HR 83 | Temp 98.9°F | Ht 68.0 in | Wt 192.0 lb

## 2021-11-07 DIAGNOSIS — Z1159 Encounter for screening for other viral diseases: Secondary | ICD-10-CM

## 2021-11-07 DIAGNOSIS — D649 Anemia, unspecified: Secondary | ICD-10-CM

## 2021-11-07 DIAGNOSIS — E559 Vitamin D deficiency, unspecified: Secondary | ICD-10-CM

## 2021-11-07 DIAGNOSIS — E538 Deficiency of other specified B group vitamins: Secondary | ICD-10-CM

## 2021-11-07 DIAGNOSIS — F1010 Alcohol abuse, uncomplicated: Secondary | ICD-10-CM

## 2021-11-07 DIAGNOSIS — F5101 Primary insomnia: Secondary | ICD-10-CM

## 2021-11-07 DIAGNOSIS — G4700 Insomnia, unspecified: Secondary | ICD-10-CM | POA: Insufficient documentation

## 2021-11-07 DIAGNOSIS — F900 Attention-deficit hyperactivity disorder, predominantly inattentive type: Secondary | ICD-10-CM

## 2021-11-07 DIAGNOSIS — E7800 Pure hypercholesterolemia, unspecified: Secondary | ICD-10-CM

## 2021-11-07 DIAGNOSIS — E876 Hypokalemia: Secondary | ICD-10-CM

## 2021-11-07 DIAGNOSIS — Z0001 Encounter for general adult medical examination with abnormal findings: Secondary | ICD-10-CM

## 2021-11-07 LAB — CBC WITH DIFFERENTIAL/PLATELET
Basophils Absolute: 0 10*3/uL (ref 0.0–0.1)
Basophils Relative: 0.6 % (ref 0.0–3.0)
Eosinophils Absolute: 0.1 10*3/uL (ref 0.0–0.7)
Eosinophils Relative: 2.1 % (ref 0.0–5.0)
HCT: 41.9 % (ref 39.0–52.0)
Hemoglobin: 14.4 g/dL (ref 13.0–17.0)
Lymphocytes Relative: 31 % (ref 12.0–46.0)
Lymphs Abs: 1.7 10*3/uL (ref 0.7–4.0)
MCHC: 34.3 g/dL (ref 30.0–36.0)
MCV: 87.7 fl (ref 78.0–100.0)
Monocytes Absolute: 0.4 10*3/uL (ref 0.1–1.0)
Monocytes Relative: 6.4 % (ref 3.0–12.0)
Neutro Abs: 3.4 10*3/uL (ref 1.4–7.7)
Neutrophils Relative %: 59.9 % (ref 43.0–77.0)
Platelets: 230 10*3/uL (ref 150.0–400.0)
RBC: 4.77 Mil/uL (ref 4.22–5.81)
RDW: 13 % (ref 11.5–15.5)
WBC: 5.6 10*3/uL (ref 4.0–10.5)

## 2021-11-07 LAB — HEPATIC FUNCTION PANEL
ALT: 18 U/L (ref 0–53)
AST: 20 U/L (ref 0–37)
Albumin: 4.7 g/dL (ref 3.5–5.2)
Alkaline Phosphatase: 49 U/L (ref 39–117)
Bilirubin, Direct: 0.2 mg/dL (ref 0.0–0.3)
Total Bilirubin: 0.7 mg/dL (ref 0.2–1.2)
Total Protein: 7.3 g/dL (ref 6.0–8.3)

## 2021-11-07 LAB — LIPID PANEL
Cholesterol: 157 mg/dL (ref 0–200)
HDL: 68.3 mg/dL (ref >39.00)
LDL Cholesterol: 75 mg/dL (ref 0–99)
NonHDL: 88.67
Total CHOL/HDL Ratio: 2
Triglycerides: 67 mg/dL (ref 0.0–149.0)
VLDL: 13.4 mg/dL (ref 0.0–40.0)

## 2021-11-07 LAB — URINALYSIS, ROUTINE W REFLEX MICROSCOPIC
Bilirubin Urine: NEGATIVE
Hgb urine dipstick: NEGATIVE
Ketones, ur: NEGATIVE
Leukocytes,Ua: NEGATIVE
Nitrite: NEGATIVE
RBC / HPF: NONE SEEN (ref 0–?)
Specific Gravity, Urine: 1.02 (ref 1.000–1.030)
Total Protein, Urine: NEGATIVE
Urine Glucose: NEGATIVE
Urobilinogen, UA: 0.2 (ref 0.0–1.0)
WBC, UA: NONE SEEN (ref 0–?)
pH: 6 (ref 5.0–8.0)

## 2021-11-07 LAB — IBC PANEL
Iron: 114 ug/dL (ref 42–165)
Saturation Ratios: 31.8 % (ref 20.0–50.0)
TIBC: 358.4 ug/dL (ref 250.0–450.0)
Transferrin: 256 mg/dL (ref 212.0–360.0)

## 2021-11-07 MED ORDER — HYDROXYZINE PAMOATE 25 MG PO CAPS: 25.0000 mg | ORAL_CAPSULE | Freq: Three times a day (TID) | ORAL | 5 refills | Status: AC

## 2021-11-07 MED ORDER — AMPHETAMINE-DEXTROAMPHET ER 30 MG PO CP24
30.0000 mg | ORAL_CAPSULE | ORAL | 0 refills | Status: DC
Start: 2021-11-07 — End: 2021-11-16

## 2021-11-07 MED ORDER — TRAZODONE HCL 50 MG PO TABS: 50.0000 mg | ORAL_TABLET | Freq: Every day | ORAL | 1 refills | Status: AC

## 2021-11-07 MED ORDER — AMPHETAMINE-DEXTROAMPHETAMINE 20 MG PO TABS: 20.0000 mg | ORAL_TABLET | Freq: Every day | ORAL | 0 refills | Status: DC | PRN
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
## 2021-11-07 NOTE — Assessment & Plan Note (Signed)
Age and sex appropriate education and counseling updated with regular exercise and diet ?Referrals for preventative services - none needed ?Immunizations addressed - declines fu shot, covid booster, ?Smoking counseling  - none needed ?Evidence for depression or other mood disorder - chronic anxiety improved ?Most recent labs reviewed. ?I have personally reviewed and have noted: ?1) the patient's medical and social history ?2) The patient's current medications and supplements ?3) The patient's height, weight, and BMI have been recorded in the chart ? ?

## 2021-11-07 NOTE — Assessment & Plan Note (Signed)
Lab Results  ?Component Value Date  ? LDLCALC 68 05/17/2021  ? ?Stable, pt to continue current low chol diet ? ?

## 2021-11-07 NOTE — Assessment & Plan Note (Signed)
No overt blood loss, for f/u iron lab as well ?

## 2021-11-07 NOTE — Assessment & Plan Note (Signed)
Improved, to continue current trazodone ?

## 2021-11-07 NOTE — Assessment & Plan Note (Signed)
Stable, continue current med tx ?

## 2021-11-07 NOTE — Assessment & Plan Note (Signed)
None for 6 mo, feels much improved, ?

## 2021-11-07 NOTE — Assessment & Plan Note (Signed)
Mild, likely GI loss related, for f/u lab ?

## 2021-11-07 NOTE — Assessment & Plan Note (Signed)
Last vitamin D Lab Results  Component Value Date   VD25OH 22.87 (L) 05/17/2021   Low, to start oral replacement  

## 2021-11-07 NOTE — Patient Instructions (Signed)

## 2021-11-08 LAB — VITAMIN B12: Vitamin B-12: 224 pg/mL (ref 211–911)

## 2021-11-08 LAB — FERRITIN: Ferritin: 82.1 ng/mL (ref 22.0–322.0)

## 2021-11-08 LAB — TSH: TSH: 1.29 u[IU]/mL (ref 0.35–5.50)

## 2021-11-08 LAB — VITAMIN D 25 HYDROXY (VIT D DEFICIENCY, FRACTURES): VITD: 28.13 ng/mL — ABNORMAL LOW (ref 30.00–100.00)

## 2021-11-16 ENCOUNTER — Telehealth: Payer: Self-pay | Admitting: Internal Medicine

## 2021-11-16 DIAGNOSIS — F900 Attention-deficit hyperactivity disorder, predominantly inattentive type: Secondary | ICD-10-CM

## 2021-11-16 MED ORDER — AMPHETAMINE-DEXTROAMPHETAMINE 20 MG PO TABS: 20.0000 mg | ORAL_TABLET | Freq: Every day | ORAL | 0 refills | Status: DC | PRN

## 2021-11-16 MED ORDER — AMPHETAMINE-DEXTROAMPHET ER 30 MG PO CP24: 30.0000 mg | ORAL_CAPSULE | ORAL | 0 refills | Status: DC

## 2021-11-16 NOTE — Telephone Encounter (Signed)
1.Medication Requested:  ?amphetamine-dextroamphetamine (ADDERALL XR) 30 MG 24 hr capsule ? ?amphetamine-dextroamphetamine (ADDERALL) 20 MG tablet ? ?2. Pharmacy (Name, Street, [place]): [facility], [address] ? ?3. On Med List: Y ? ?4. Last Visit with PCP: 11-07-2021 ? ?5. Next visit date with PCP: n/a ? ? ?Agent: Please be advised that RX refills may take up to 3 business days. We ask that you follow-up with your pharmacy.  ?

## 2021-11-16 NOTE — Telephone Encounter (Signed)
Done erx 

## 2021-11-21 MED ORDER — AMPHETAMINE-DEXTROAMPHET ER 30 MG PO CP24: 30.0000 mg | ORAL_CAPSULE | ORAL | 0 refills | Status: DC

## 2021-11-21 MED ORDER — AMPHETAMINE-DEXTROAMPHETAMINE 20 MG PO TABS: 20.0000 mg | ORAL_TABLET | Freq: Every day | ORAL | 0 refills | Status: DC | PRN

## 2021-11-21 NOTE — Addendum Note (Signed)
Addended by: Biagio Borg on: 11/21/2021 05:13 PM ? ? Modules accepted: Orders ? ?

## 2021-11-21 NOTE — Telephone Encounter (Signed)
Ok this is done 

## 2021-11-21 NOTE — Telephone Encounter (Signed)
Pt is calling back stating that Pharmacy was out of stock and he called around and found a pharmacy that has them. ? ?Please resend Rx's to  ?[facility] [address] [place], [place] - [address] AT [facility] [address] ?

## 2021-12-19 ENCOUNTER — Telehealth: Payer: Self-pay | Admitting: Internal Medicine

## 2021-12-19 DIAGNOSIS — F900 Attention-deficit hyperactivity disorder, predominantly inattentive type: Secondary | ICD-10-CM

## 2021-12-19 MED ORDER — AMPHETAMINE-DEXTROAMPHETAMINE 20 MG PO TABS: 20.0000 mg | ORAL_TABLET | Freq: Every day | ORAL | 0 refills | Status: DC | PRN

## 2021-12-19 MED ORDER — AMPHETAMINE-DEXTROAMPHET ER 30 MG PO CP24: 30.0000 mg | ORAL_CAPSULE | ORAL | 0 refills | Status: DC

## 2021-12-19 NOTE — Telephone Encounter (Signed)
1.Medication Requested: ?amphetamine-dextroamphetamine (ADDERALL XR) 30 MG 24 hr capsule ?amphetamine-dextroamphetamine (ADDERALL) 20 MG tablet ?2. Pharmacy (Name, Street, [place]): ?[facility], [place]. AT [address] Phone:  [phone]  ?Fax:  [phone]  ?  ? ?3. On Med List: yes ? ?4. Last Visit with PCP: ? ?5. Next visit date with PCP: ? ? ?Agent: Please be advised that RX refills may take up to 3 business days. We ask that you follow-up with your pharmacy.  ?

## 2022-01-18 ENCOUNTER — Telehealth: Payer: Self-pay

## 2022-01-18 DIAGNOSIS — F900 Attention-deficit hyperactivity disorder, predominantly inattentive type: Secondary | ICD-10-CM

## 2022-01-18 MED ORDER — AMPHETAMINE-DEXTROAMPHETAMINE 20 MG PO TABS: 20.0000 mg | ORAL_TABLET | Freq: Every day | ORAL | 0 refills | Status: DC | PRN

## 2022-01-18 MED ORDER — AMPHETAMINE-DEXTROAMPHET ER 30 MG PO CP24: 30.0000 mg | ORAL_CAPSULE | ORAL | 0 refills | Status: DC

## 2022-01-18 NOTE — Telephone Encounter (Signed)
Pt is requesting a refill on: amphetamine-dextroamphetamine (ADDERALL XR) 30 MG 24 hr capsule   amphetamine-dextroamphetamine (ADDERALL) 20 MG tablet  Pharmacy: [facility] [address] [place], [place] - [address]  LOV 11/07/21

## 2022-02-21 ENCOUNTER — Telehealth: Payer: Self-pay | Admitting: Internal Medicine

## 2022-02-21 NOTE — Telephone Encounter (Signed)
Caller & Relationship to patient: Henry Maldonado  Call back number: [phone]  Date of last office visit: 11/07/21  Date of next office visit:   Medication(s) to be refilled:  amphetamine-dextroamphetamine (ADDERALL XR) 30 MG 24 hr capsule  amphetamine-dextroamphetamine (ADDERALL) 20 MG tablet      Preferred Pharmacy:  [facility] [address] [place], [place] - [address] Phone:  [phone]  Fax:  [phone]

## 2022-03-31 ENCOUNTER — Telehealth: Payer: Self-pay | Admitting: Internal Medicine

## 2022-03-31 DIAGNOSIS — F900 Attention-deficit hyperactivity disorder, predominantly inattentive type: Secondary | ICD-10-CM

## 2022-03-31 MED ORDER — AMPHETAMINE-DEXTROAMPHET ER 30 MG PO CP24: 30.0000 mg | ORAL_CAPSULE | ORAL | 0 refills | Status: DC

## 2022-03-31 MED ORDER — AMPHETAMINE-DEXTROAMPHETAMINE 20 MG PO TABS: 20.0000 mg | ORAL_TABLET | Freq: Every day | ORAL | 0 refills | Status: DC | PRN

## 2022-03-31 NOTE — Telephone Encounter (Signed)
Patient needs both his adderall's refilled - extended release 30 mg and reg. Release 20 mg.  Patient would like this to go to [facility] on [address].  Patient made his 6 month appointment for his medication renewal.

## 2022-04-03 NOTE — Telephone Encounter (Signed)
These are already I believe last Friday, thanks

## 2022-05-01 ENCOUNTER — Ambulatory Visit: Payer: Self-pay | Admitting: Internal Medicine

## 2022-05-10 ENCOUNTER — Telehealth: Payer: Self-pay | Admitting: Internal Medicine

## 2022-05-10 DIAGNOSIS — F900 Attention-deficit hyperactivity disorder, predominantly inattentive type: Secondary | ICD-10-CM

## 2022-05-10 MED ORDER — AMPHETAMINE-DEXTROAMPHET ER 30 MG PO CP24
30.0000 mg | ORAL_CAPSULE | ORAL | 0 refills | Status: DC
Start: 2022-05-10 — End: 2022-06-21

## 2022-05-10 MED ORDER — AMPHETAMINE-DEXTROAMPHETAMINE 20 MG PO TABS: 20.0000 mg | ORAL_TABLET | Freq: Every day | ORAL | 0 refills | Status: DC | PRN

## 2022-05-10 NOTE — Telephone Encounter (Signed)
Patient needs his adderall 30 mg xr and 20 mg. Regular release.  Please send to [facility] on [address].

## 2022-05-10 NOTE — Telephone Encounter (Signed)
Done erx 

## 2022-05-10 NOTE — Telephone Encounter (Signed)
LOV 11/07/21

## 2022-06-19 ENCOUNTER — Ambulatory Visit: Payer: Self-pay | Admitting: Internal Medicine

## 2022-06-21 ENCOUNTER — Ambulatory Visit (INDEPENDENT_AMBULATORY_CARE_PROVIDER_SITE_OTHER): Payer: Self-pay | Admitting: Internal Medicine

## 2022-06-21 VITALS — BP 122/70 | HR 100 | Temp 98.0°F | Ht 68.0 in | Wt 169.0 lb

## 2022-06-21 DIAGNOSIS — E559 Vitamin D deficiency, unspecified: Secondary | ICD-10-CM

## 2022-06-21 DIAGNOSIS — E7800 Pure hypercholesterolemia, unspecified: Secondary | ICD-10-CM

## 2022-06-21 DIAGNOSIS — F900 Attention-deficit hyperactivity disorder, predominantly inattentive type: Secondary | ICD-10-CM

## 2022-06-21 MED ORDER — AMPHETAMINE-DEXTROAMPHET ER 30 MG PO CP24: 30.0000 mg | ORAL_CAPSULE | ORAL | 0 refills | Status: DC

## 2022-06-21 MED ORDER — AMPHETAMINE-DEXTROAMPHETAMINE 20 MG PO TABS: 20.0000 mg | ORAL_TABLET | Freq: Every day | ORAL | 0 refills | Status: DC | PRN

## 2022-06-21 NOTE — Patient Instructions (Signed)
Please take all new medication as prescribed  Please continue all other medications as before, and refills have been done if requested.  Please have the pharmacy call with any other refills you may need.  Please continue your efforts at being more active, low cholesterol diet, and weight control.  Please keep your appointments with your specialists as you may have planned  Please make an Appointment to return in 6 months, or sooner if needed 
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
## 2022-06-22 ENCOUNTER — Encounter: Payer: Self-pay | Admitting: Internal Medicine

## 2022-06-22 NOTE — Assessment & Plan Note (Signed)
Stable overall, for contd adderall asd

## 2022-06-22 NOTE — Assessment & Plan Note (Signed)
Lab Results  Component Value Date   LDLCALC 75 11/07/2021   Stable, pt to continue current low chol diet

## 2022-06-22 NOTE — Assessment & Plan Note (Signed)
Last vitamin D Lab Results  Component Value Date   VD25OH 28.13 (L) 11/07/2021   Low, reminded to start oral replacement

## 2022-07-21 ENCOUNTER — Telehealth: Payer: Self-pay | Admitting: Internal Medicine

## 2022-07-21 DIAGNOSIS — F900 Attention-deficit hyperactivity disorder, predominantly inattentive type: Secondary | ICD-10-CM

## 2022-07-21 MED ORDER — AMPHETAMINE-DEXTROAMPHET ER 30 MG PO CP24
30.0000 mg | ORAL_CAPSULE | ORAL | 0 refills | Status: DC
Start: 2022-07-21 — End: 2022-08-15

## 2022-07-21 MED ORDER — AMPHETAMINE-DEXTROAMPHETAMINE 20 MG PO TABS: 20.0000 mg | ORAL_TABLET | Freq: Every day | ORAL | 0 refills | Status: DC | PRN

## 2022-07-21 NOTE — Telephone Encounter (Signed)
Done erx 

## 2022-07-21 NOTE — Telephone Encounter (Signed)
MD is not in the office today per protocol 24-72 hours on refills will forward to his desktop to review,,/lmb

## 2022-07-21 NOTE — Telephone Encounter (Signed)
Caller & Relationship to patient: [place]   Call back number: [phone]   Date of last office visit:06/21/22   Date of next office visit:not scheduled yet   Medication(s) to be refilled: amphetamine-dextroamphetamine (ADDERALL XR) 30 MG 24 hr capsule amphetamine-dextroamphetamine (ADDERALL) 20 MG tablet         Preferred Pharmacy:  [facility] - [place], [place] - [address]

## 2022-08-15 ENCOUNTER — Telehealth: Payer: Self-pay | Admitting: Internal Medicine

## 2022-08-15 DIAGNOSIS — F900 Attention-deficit hyperactivity disorder, predominantly inattentive type: Secondary | ICD-10-CM

## 2022-08-15 MED ORDER — AMPHETAMINE-DEXTROAMPHET ER 30 MG PO CP24: 30.0000 mg | ORAL_CAPSULE | ORAL | 0 refills | Status: DC

## 2022-08-15 MED ORDER — AMPHETAMINE-DEXTROAMPHETAMINE 20 MG PO TABS: 20.0000 mg | ORAL_TABLET | Freq: Every day | ORAL | 0 refills | Status: DC | PRN

## 2022-08-15 NOTE — Telephone Encounter (Signed)
Caller & Relationship to patient:  self   Call back number:[phone]   Date of last office visit:  06/21/2022   Date of next office visit:  09/14/2022   Medication(s) to be refilled:Adderall XR 30 mg and 20 mg        Preferred Pharmacy:[facility] on [address]

## 2022-08-15 NOTE — Telephone Encounter (Signed)
Patient informed of refill via mychart

## 2022-08-15 NOTE — Telephone Encounter (Signed)
Ok done erx 

## 2022-09-14 ENCOUNTER — Ambulatory Visit: Payer: Self-pay | Admitting: Internal Medicine

## 2022-09-15 ENCOUNTER — Encounter: Payer: Self-pay | Admitting: Internal Medicine

## 2022-09-15 DIAGNOSIS — F900 Attention-deficit hyperactivity disorder, predominantly inattentive type: Secondary | ICD-10-CM

## 2022-09-15 MED ORDER — AMPHETAMINE-DEXTROAMPHET ER 30 MG PO CP24: 30.0000 mg | ORAL_CAPSULE | ORAL | 0 refills | Status: DC

## 2022-09-15 MED ORDER — AMPHETAMINE-DEXTROAMPHETAMINE 20 MG PO TABS: 20.0000 mg | ORAL_TABLET | Freq: Every day | ORAL | 0 refills | Status: DC | PRN

## 2022-09-26 ENCOUNTER — Ambulatory Visit: Payer: Self-pay | Admitting: Internal Medicine

## 2022-10-16 ENCOUNTER — Telehealth: Payer: Self-pay | Admitting: Internal Medicine

## 2022-10-16 DIAGNOSIS — F900 Attention-deficit hyperactivity disorder, predominantly inattentive type: Secondary | ICD-10-CM

## 2022-10-16 MED ORDER — AMPHETAMINE-DEXTROAMPHETAMINE 20 MG PO TABS: 20.0000 mg | ORAL_TABLET | Freq: Every day | ORAL | 0 refills | Status: DC | PRN

## 2022-10-16 MED ORDER — AMPHETAMINE-DEXTROAMPHET ER 30 MG PO CP24: 30.0000 mg | ORAL_CAPSULE | ORAL | 0 refills | Status: DC

## 2022-10-16 NOTE — Telephone Encounter (Signed)
Done erx 

## 2022-10-16 NOTE — Telephone Encounter (Signed)
Caller & Relationship to patient: PT  Call back number: [phone]  Date of last office visit: 06/21/22  Date of next office visit: 12/22/2022  Medication(s) to be refilled:  amphetamine-dextroamphetamine (ADDERALL) 20 MG tablet   amphetamine-dextroamphetamine (ADDERALL XR) 30 MG 24 hr capsule  Preferred Pharmacy:  [facility], [address]   Informed PT of the No Show's on last couple of appointments. Looks like PT was being seen every 6 months so set them up for new appointment in May.

## 2022-11-17 ENCOUNTER — Telehealth: Payer: Self-pay | Admitting: Internal Medicine

## 2022-11-17 DIAGNOSIS — F900 Attention-deficit hyperactivity disorder, predominantly inattentive type: Secondary | ICD-10-CM

## 2022-11-17 MED ORDER — AMPHETAMINE-DEXTROAMPHETAMINE 20 MG PO TABS: 20.0000 mg | ORAL_TABLET | Freq: Every day | ORAL | 0 refills | Status: DC | PRN

## 2022-11-17 MED ORDER — AMPHETAMINE-DEXTROAMPHET ER 30 MG PO CP24: 30.0000 mg | ORAL_CAPSULE | ORAL | 0 refills | Status: DC

## 2022-11-17 NOTE — Telephone Encounter (Signed)
Prescription Request  11/17/2022  LOV: 06/21/2022  What is the name of the medication or equipment? amphetamine-dextroamphetamine (ADDERALL XR) 30 MG 24 hr capsule  amphetamine-dextroamphetamine (ADDERALL) 20 MG tablet  Have you contacted your pharmacy to request a refill? No   Which pharmacy would you like this sent to?  [facility] - [place], [place] - [address] [place] [address] Phone: [phone] Fax: [phone]     Patient notified that their request is being sent to the clinical staff for review and that they should receive a response within 2 business days.   Please advise at Mobile [phone] (mobile)   Next OV is 12/22/22

## 2022-12-22 ENCOUNTER — Encounter: Payer: Self-pay | Admitting: Internal Medicine

## 2022-12-22 ENCOUNTER — Ambulatory Visit (INDEPENDENT_AMBULATORY_CARE_PROVIDER_SITE_OTHER): Payer: Self-pay | Admitting: Internal Medicine

## 2022-12-22 VITALS — BP 120/72 | HR 96 | Temp 98.1°F | Ht 68.0 in | Wt 160.0 lb

## 2022-12-22 DIAGNOSIS — L259 Unspecified contact dermatitis, unspecified cause: Secondary | ICD-10-CM

## 2022-12-22 DIAGNOSIS — E538 Deficiency of other specified B group vitamins: Secondary | ICD-10-CM

## 2022-12-22 DIAGNOSIS — E7800 Pure hypercholesterolemia, unspecified: Secondary | ICD-10-CM

## 2022-12-22 DIAGNOSIS — F900 Attention-deficit hyperactivity disorder, predominantly inattentive type: Secondary | ICD-10-CM

## 2022-12-22 DIAGNOSIS — R739 Hyperglycemia, unspecified: Secondary | ICD-10-CM

## 2022-12-22 DIAGNOSIS — E559 Vitamin D deficiency, unspecified: Secondary | ICD-10-CM

## 2022-12-22 MED ORDER — AMPHETAMINE-DEXTROAMPHET ER 30 MG PO CP24: 30.0000 mg | ORAL_CAPSULE | ORAL | 0 refills | Status: DC

## 2022-12-22 MED ORDER — TRIAMCINOLONE ACETONIDE 0.1 % EX CREA: 1.0000 | TOPICAL_CREAM | Freq: Two times a day (BID) | CUTANEOUS | 0 refills | Status: AC

## 2022-12-22 MED ORDER — PREDNISONE 10 MG PO TABS: ORAL_TABLET | ORAL | 0 refills | Status: AC

## 2022-12-22 MED ORDER — AMPHETAMINE-DEXTROAMPHETAMINE 20 MG PO TABS: 20.0000 mg | ORAL_TABLET | Freq: Every day | ORAL | 0 refills | Status: DC | PRN
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
## 2022-12-22 NOTE — Patient Instructions (Signed)
Please take all new medication as prescribed - the prednisone and steroid cream as needed  Please continue all other medications as before, including the adderall as before  Please have the pharmacy call with any other refills you may need.  Please continue your efforts at being more active, low cholesterol diet, and weight control  Please keep your appointments with your specialists as you may have planned  Please make an Appointment to return in 6 months, or sooner if needed, also with Lab Appointment for testing done 3-5 days before at the FIRST FLOOR Lab (so this is for TWO appointments - please see the scheduling desk as you leave)

## 2022-12-25 ENCOUNTER — Encounter: Payer: Self-pay | Admitting: Internal Medicine

## 2022-12-25 DIAGNOSIS — L259 Unspecified contact dermatitis, unspecified cause: Secondary | ICD-10-CM | POA: Insufficient documentation

## 2022-12-25 NOTE — Assessment & Plan Note (Signed)
Mild several areas to arms - for prednisone and triam steroid cream

## 2022-12-25 NOTE — Assessment & Plan Note (Signed)
Last vitamin D Lab Results  Component Value Date   VD25OH 28.13 (L) 11/07/2021   Low, to start oral replacement

## 2022-12-25 NOTE — Assessment & Plan Note (Signed)
Stable, cont adderall

## 2023-01-18 ENCOUNTER — Telehealth: Payer: Self-pay | Admitting: Internal Medicine

## 2023-01-18 MED ORDER — AMPHETAMINE-DEXTROAMPHET ER 30 MG PO CP24: 30.0000 mg | ORAL_CAPSULE | ORAL | 0 refills | Status: DC

## 2023-01-18 NOTE — Telephone Encounter (Signed)
Prescription Request  01/18/2023  LOV: 12/22/2022  What is the name of the medication or equipment? amphetamine-dextroamphetamine (ADDERALL XR) 30 MG 24 hr capsule  amphetamine-dextroamphetamine (ADDERALL) 20 MG tablet  Have you contacted your pharmacy to request a refill? No   Which pharmacy would you like this sent to?    [facility] - [place], [facility] [phone] [place], [place] - [address] [place] [address] Phone: [phone] Fax: [phone]  Patient notified that their request is being sent to the clinical staff for review and that they should receive a response within 2 business days.   Please advise at Mobile [phone] (mobile)

## 2023-01-18 NOTE — Telephone Encounter (Signed)
Done erx 

## 2023-01-22 ENCOUNTER — Other Ambulatory Visit: Payer: Self-pay | Admitting: Internal Medicine

## 2023-01-22 DIAGNOSIS — F900 Attention-deficit hyperactivity disorder, predominantly inattentive type: Secondary | ICD-10-CM

## 2023-01-23 MED ORDER — AMPHETAMINE-DEXTROAMPHETAMINE 20 MG PO TABS
20.0000 mg | ORAL_TABLET | Freq: Every day | ORAL | 0 refills | Status: DC | PRN
Start: 2023-01-23 — End: 2023-02-15

## 2023-01-23 MED ORDER — AMPHETAMINE-DEXTROAMPHET ER 30 MG PO CP24: 30.0000 mg | ORAL_CAPSULE | ORAL | 0 refills | Status: DC

## 2023-01-23 NOTE — Telephone Encounter (Signed)
MD is out of the office today will forward ti MD desktop for refill.Marland KitchenRaechel Chute

## 2023-02-15 ENCOUNTER — Other Ambulatory Visit: Payer: Self-pay | Admitting: Internal Medicine

## 2023-02-15 DIAGNOSIS — F900 Attention-deficit hyperactivity disorder, predominantly inattentive type: Secondary | ICD-10-CM

## 2023-02-16 MED ORDER — AMPHETAMINE-DEXTROAMPHET ER 30 MG PO CP24: 30.0000 mg | ORAL_CAPSULE | ORAL | 0 refills | Status: DC

## 2023-02-16 MED ORDER — AMPHETAMINE-DEXTROAMPHETAMINE 20 MG PO TABS: 20.0000 mg | ORAL_TABLET | Freq: Every day | ORAL | 0 refills | Status: DC | PRN

## 2023-03-16 ENCOUNTER — Other Ambulatory Visit: Payer: Self-pay | Admitting: Internal Medicine

## 2023-03-16 DIAGNOSIS — F900 Attention-deficit hyperactivity disorder, predominantly inattentive type: Secondary | ICD-10-CM

## 2023-03-16 MED ORDER — AMPHETAMINE-DEXTROAMPHET ER 30 MG PO CP24: 30.0000 mg | ORAL_CAPSULE | ORAL | 0 refills | Status: DC

## 2023-03-16 MED ORDER — AMPHETAMINE-DEXTROAMPHETAMINE 20 MG PO TABS: 20.0000 mg | ORAL_TABLET | Freq: Every day | ORAL | 0 refills | Status: DC | PRN

## 2023-04-13 ENCOUNTER — Other Ambulatory Visit: Payer: Self-pay | Admitting: Internal Medicine

## 2023-04-13 DIAGNOSIS — F900 Attention-deficit hyperactivity disorder, predominantly inattentive type: Secondary | ICD-10-CM

## 2023-04-13 MED ORDER — AMPHETAMINE-DEXTROAMPHET ER 30 MG PO CP24: 30.0000 mg | ORAL_CAPSULE | ORAL | 0 refills | Status: DC

## 2023-04-13 MED ORDER — AMPHETAMINE-DEXTROAMPHETAMINE 20 MG PO TABS
20.0000 mg | ORAL_TABLET | Freq: Every day | ORAL | 0 refills | Status: AC | PRN
Start: 2023-04-13 — End: ?

## 2023-05-14 ENCOUNTER — Other Ambulatory Visit: Payer: Self-pay | Admitting: Internal Medicine

## 2023-05-14 DIAGNOSIS — F900 Attention-deficit hyperactivity disorder, predominantly inattentive type: Secondary | ICD-10-CM

## 2023-05-15 MED ORDER — AMPHETAMINE-DEXTROAMPHETAMINE 20 MG PO TABS: 20.0000 mg | ORAL_TABLET | Freq: Every day | ORAL | 0 refills | Status: DC | PRN

## 2023-05-15 MED ORDER — AMPHETAMINE-DEXTROAMPHET ER 30 MG PO CP24: 30.0000 mg | ORAL_CAPSULE | ORAL | 0 refills | Status: DC

## 2023-06-11 ENCOUNTER — Other Ambulatory Visit: Payer: Self-pay | Admitting: Internal Medicine

## 2023-06-11 DIAGNOSIS — F900 Attention-deficit hyperactivity disorder, predominantly inattentive type: Secondary | ICD-10-CM

## 2023-06-11 MED ORDER — AMPHETAMINE-DEXTROAMPHETAMINE 20 MG PO TABS
20.0000 mg | ORAL_TABLET | Freq: Every day | ORAL | 0 refills | Status: DC | PRN
Start: 2023-06-11 — End: 2023-07-16

## 2023-06-11 MED ORDER — AMPHETAMINE-DEXTROAMPHET ER 30 MG PO CP24: 30.0000 mg | ORAL_CAPSULE | ORAL | 0 refills | Status: DC

## 2023-07-16 ENCOUNTER — Other Ambulatory Visit: Payer: Self-pay | Admitting: Internal Medicine

## 2023-07-16 DIAGNOSIS — F900 Attention-deficit hyperactivity disorder, predominantly inattentive type: Secondary | ICD-10-CM

## 2023-07-16 MED ORDER — AMPHETAMINE-DEXTROAMPHETAMINE 20 MG PO TABS: 20.0000 mg | ORAL_TABLET | Freq: Every day | ORAL | 0 refills | Status: DC | PRN

## 2023-07-16 MED ORDER — AMPHETAMINE-DEXTROAMPHET ER 30 MG PO CP24: 30.0000 mg | ORAL_CAPSULE | ORAL | 0 refills | Status: DC

## 2023-08-09 ENCOUNTER — Other Ambulatory Visit: Payer: Self-pay | Admitting: Internal Medicine

## 2023-08-09 DIAGNOSIS — F900 Attention-deficit hyperactivity disorder, predominantly inattentive type: Secondary | ICD-10-CM

## 2023-08-09 MED ORDER — AMPHETAMINE-DEXTROAMPHET ER 30 MG PO CP24: 30.0000 mg | ORAL_CAPSULE | ORAL | 0 refills | Status: DC

## 2023-08-09 MED ORDER — AMPHETAMINE-DEXTROAMPHETAMINE 20 MG PO TABS: 20.0000 mg | ORAL_TABLET | Freq: Every day | ORAL | 0 refills | Status: DC | PRN

## 2023-09-10 ENCOUNTER — Other Ambulatory Visit: Payer: Self-pay | Admitting: Internal Medicine

## 2023-09-10 DIAGNOSIS — F900 Attention-deficit hyperactivity disorder, predominantly inattentive type: Secondary | ICD-10-CM

## 2023-09-10 MED ORDER — AMPHETAMINE-DEXTROAMPHETAMINE 20 MG PO TABS: 20.0000 mg | ORAL_TABLET | Freq: Every day | ORAL | 0 refills | Status: DC | PRN

## 2023-09-10 MED ORDER — AMPHETAMINE-DEXTROAMPHET ER 30 MG PO CP24: 30.0000 mg | ORAL_CAPSULE | ORAL | 0 refills | Status: DC

## 2023-09-13 ENCOUNTER — Encounter: Payer: Self-pay | Admitting: Internal Medicine

## 2023-10-05 ENCOUNTER — Encounter: Payer: Self-pay | Admitting: Internal Medicine

## 2023-10-05 ENCOUNTER — Ambulatory Visit: Payer: BC Managed Care – PPO | Admitting: Internal Medicine

## 2023-10-05 VITALS — BP 122/80 | HR 79 | Temp 97.9°F | Ht 68.0 in | Wt 196.0 lb

## 2023-10-05 DIAGNOSIS — Z0001 Encounter for general adult medical examination with abnormal findings: Secondary | ICD-10-CM

## 2023-10-05 DIAGNOSIS — R739 Hyperglycemia, unspecified: Secondary | ICD-10-CM | POA: Diagnosis not present

## 2023-10-05 DIAGNOSIS — E538 Deficiency of other specified B group vitamins: Secondary | ICD-10-CM | POA: Diagnosis not present

## 2023-10-05 DIAGNOSIS — F900 Attention-deficit hyperactivity disorder, predominantly inattentive type: Secondary | ICD-10-CM

## 2023-10-05 DIAGNOSIS — E7800 Pure hypercholesterolemia, unspecified: Secondary | ICD-10-CM

## 2023-10-05 DIAGNOSIS — E559 Vitamin D deficiency, unspecified: Secondary | ICD-10-CM

## 2023-10-05 LAB — URINALYSIS, ROUTINE W REFLEX MICROSCOPIC
Bilirubin Urine: NEGATIVE
Hgb urine dipstick: NEGATIVE
Ketones, ur: NEGATIVE
Leukocytes,Ua: NEGATIVE
Nitrite: NEGATIVE
RBC / HPF: NONE SEEN (ref 0–?)
Specific Gravity, Urine: 1.02 (ref 1.000–1.030)
Total Protein, Urine: NEGATIVE
Urine Glucose: NEGATIVE
Urobilinogen, UA: 0.2 (ref 0.0–1.0)
WBC, UA: NONE SEEN (ref 0–?)
pH: 6.5 (ref 5.0–8.0)

## 2023-10-05 LAB — CBC WITH DIFFERENTIAL/PLATELET
Basophils Absolute: 0 10*3/uL (ref 0.0–0.1)
Basophils Relative: 0.9 % (ref 0.0–3.0)
Eosinophils Absolute: 0.1 10*3/uL (ref 0.0–0.7)
Eosinophils Relative: 2.8 % (ref 0.0–5.0)
HCT: 43.4 % (ref 39.0–52.0)
Hemoglobin: 15.2 g/dL (ref 13.0–17.0)
Lymphocytes Relative: 40.3 % (ref 12.0–46.0)
Lymphs Abs: 2.1 10*3/uL (ref 0.7–4.0)
MCHC: 35 g/dL (ref 30.0–36.0)
MCV: 90.7 fL (ref 78.0–100.0)
Monocytes Absolute: 0.5 10*3/uL (ref 0.1–1.0)
Monocytes Relative: 9.4 % (ref 3.0–12.0)
Neutro Abs: 2.4 10*3/uL (ref 1.4–7.7)
Neutrophils Relative %: 46.6 % (ref 43.0–77.0)
Platelets: 219 10*3/uL (ref 150.0–400.0)
RBC: 4.78 Mil/uL (ref 4.22–5.81)
RDW: 13 % (ref 11.5–15.5)
WBC: 5.2 10*3/uL (ref 4.0–10.5)

## 2023-10-05 LAB — LIPID PANEL
Cholesterol: 190 mg/dL (ref 0–200)
HDL: 59.2 mg/dL (ref >39.00)
LDL Cholesterol: 91 mg/dL (ref 0–99)
NonHDL: 131.14
Total CHOL/HDL Ratio: 3
Triglycerides: 202 mg/dL — ABNORMAL HIGH (ref 0.0–149.0)
VLDL: 40.4 mg/dL — ABNORMAL HIGH (ref 0.0–40.0)

## 2023-10-05 LAB — BASIC METABOLIC PANEL
BUN: 15 mg/dL (ref 6–23)
CO2: 30 meq/L (ref 19–32)
Calcium: 9.9 mg/dL (ref 8.4–10.5)
Chloride: 101 meq/L (ref 96–112)
Creatinine, Ser: 0.85 mg/dL (ref 0.40–1.50)
GFR: 116.27 mL/min (ref >60.00)
Glucose, Bld: 74 mg/dL (ref 70–99)
Potassium: 3.9 meq/L (ref 3.5–5.1)
Sodium: 140 meq/L (ref 135–145)

## 2023-10-05 LAB — HEPATIC FUNCTION PANEL
ALT: 17 U/L (ref 0–53)
AST: 17 U/L (ref 0–37)
Albumin: 4.6 g/dL (ref 3.5–5.2)
Alkaline Phosphatase: 43 U/L (ref 39–117)
Bilirubin, Direct: 0.1 mg/dL (ref 0.0–0.3)
Total Bilirubin: 0.8 mg/dL (ref 0.2–1.2)
Total Protein: 7.2 g/dL (ref 6.0–8.3)

## 2023-10-05 LAB — VITAMIN B12: Vitamin B-12: 216 pg/mL (ref 211–911)

## 2023-10-05 LAB — HEMOGLOBIN A1C: Hgb A1c MFr Bld: 5 % (ref 4.6–6.5)

## 2023-10-05 LAB — VITAMIN D 25 HYDROXY (VIT D DEFICIENCY, FRACTURES): VITD: 19.56 ng/mL — ABNORMAL LOW (ref 30.00–100.00)

## 2023-10-05 LAB — TSH: TSH: 2.38 u[IU]/mL (ref 0.35–5.50)

## 2023-10-05 MED ORDER — AMPHETAMINE-DEXTROAMPHETAMINE 20 MG PO TABS: 20.0000 mg | ORAL_TABLET | Freq: Every day | ORAL | 0 refills | Status: DC | PRN

## 2023-10-05 MED ORDER — AMPHETAMINE-DEXTROAMPHET ER 30 MG PO CP24: 30.0000 mg | ORAL_CAPSULE | Freq: Two times a day (BID) | ORAL | 0 refills | Status: DC

## 2023-10-05 NOTE — Patient Instructions (Signed)
 Ok to increase the adderal xr 30 mg to twice per day, and continue the 20 mg as needed  Please continue all other medications as before, and refills have been done if requested.  Please have the pharmacy call with any other refills you may need.  Please continue your efforts at being more active, low cholesterol diet, and weight control.  You are otherwise up to date with prevention measures today.  Please keep your appointments with your specialists as you may have planned  Please go to the LAB at the blood drawing area for the tests to be done  You will be contacted by phone if any changes need to be made immediately.  Otherwise, you will receive a letter about your results with an explanation, but please check with MyChart first.  Please make an Appointment to return for your 1 year visit, or sooner if needed
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
## 2023-10-07 ENCOUNTER — Encounter: Payer: Self-pay | Admitting: Internal Medicine

## 2023-10-07 DIAGNOSIS — E538 Deficiency of other specified B group vitamins: Secondary | ICD-10-CM | POA: Insufficient documentation

## 2023-10-07 NOTE — Assessment & Plan Note (Signed)
 Age and sex appropriate education and counseling updated with regular exercise and diet Referrals for preventative services - none needed Immunizations addressed - declines covid booster and flu shot Smoking counseling  - none needed Evidence for depression or other mood disorder - none significant Most recent labs reviewed. I have personally reviewed and have noted: 1) the patient's medical and social history 2) The patient's current medications and supplements 3) The patient's height, weight, and BMI have been recorded in the chart

## 2023-10-07 NOTE — Assessment & Plan Note (Signed)
 Uncontrolled, ok for increased adderal xr 30 bid, and adderal 20 mg prn

## 2023-10-07 NOTE — Assessment & Plan Note (Signed)
 Lab Results  Component Value Date   VITAMINB12 216 10/05/2023   Low, to start oral replacement - b12 1000 mcg qd

## 2023-10-07 NOTE — Assessment & Plan Note (Signed)
 Lab Results  Component Value Date   LDLCALC 91 10/05/2023   Stable, pt to continue low chol diet

## 2023-10-07 NOTE — Assessment & Plan Note (Signed)
 Last vitamin D Lab Results  Component Value Date   VD25OH 19.56 (L) 10/05/2023   Low, to start oral replacement

## 2023-11-03 ENCOUNTER — Other Ambulatory Visit: Payer: Self-pay | Admitting: Internal Medicine

## 2023-11-03 DIAGNOSIS — F900 Attention-deficit hyperactivity disorder, predominantly inattentive type: Secondary | ICD-10-CM

## 2023-11-05 MED ORDER — AMPHETAMINE-DEXTROAMPHETAMINE 20 MG PO TABS: 20.0000 mg | ORAL_TABLET | Freq: Every day | ORAL | 0 refills | Status: DC | PRN

## 2023-11-05 MED ORDER — AMPHETAMINE-DEXTROAMPHET ER 30 MG PO CP24: 30.0000 mg | ORAL_CAPSULE | Freq: Two times a day (BID) | ORAL | 0 refills | Status: DC

## 2023-12-03 ENCOUNTER — Other Ambulatory Visit: Payer: Self-pay | Admitting: Internal Medicine

## 2023-12-03 DIAGNOSIS — F900 Attention-deficit hyperactivity disorder, predominantly inattentive type: Secondary | ICD-10-CM

## 2023-12-03 MED ORDER — AMPHETAMINE-DEXTROAMPHET ER 30 MG PO CP24: 30.0000 mg | ORAL_CAPSULE | Freq: Two times a day (BID) | ORAL | 0 refills | Status: DC

## 2023-12-03 MED ORDER — AMPHETAMINE-DEXTROAMPHETAMINE 20 MG PO TABS: 20.0000 mg | ORAL_TABLET | Freq: Every day | ORAL | 0 refills | Status: DC | PRN

## 2023-12-31 ENCOUNTER — Other Ambulatory Visit: Payer: Self-pay | Admitting: Internal Medicine

## 2023-12-31 DIAGNOSIS — F900 Attention-deficit hyperactivity disorder, predominantly inattentive type: Secondary | ICD-10-CM

## 2023-12-31 MED ORDER — AMPHETAMINE-DEXTROAMPHET ER 30 MG PO CP24: 30.0000 mg | ORAL_CAPSULE | Freq: Two times a day (BID) | ORAL | 0 refills | Status: DC

## 2023-12-31 MED ORDER — AMPHETAMINE-DEXTROAMPHETAMINE 20 MG PO TABS: 20.0000 mg | ORAL_TABLET | Freq: Every day | ORAL | 0 refills | Status: DC | PRN

## 2023-12-31 NOTE — Telephone Encounter (Signed)
 Copied from CRM [id number]. Topic: Clinical - Medication Refill >> Dec 31, 2023  9:06 AM [name] wrote: Medication: amphetamine -dextroamphetamine  (ADDERALL XR) 30 MG 24 hr capsule and 20mg  adderall  Has the patient contacted their pharmacy? Yes (Agent: If no, request that the patient contact the pharmacy for the refill. If patient does not wish to contact the pharmacy document the reason why and proceed with request.) (Agent: If yes, when and what did the pharmacy advise?)  This is the patient's preferred pharmacy:   [id number] [place] [facility] - [address] [place] [address] Phone: [phone] Fax: [phone]  Is this the correct pharmacy for this prescription? Yes If no, delete pharmacy and type the correct one.   Has the prescription been filled recently? No  Is the patient out of the medication? Yes  Has the patient been seen for an appointment in the last year OR does the patient have an upcoming appointment? Yes  Can we respond through MyChart? Yes  Agent: Please be advised that Rx refills may take up to 3 business days. We ask that you follow-up with your pharmacy.

## 2024-01-30 ENCOUNTER — Other Ambulatory Visit: Payer: Self-pay | Admitting: Internal Medicine

## 2024-01-30 DIAGNOSIS — F900 Attention-deficit hyperactivity disorder, predominantly inattentive type: Secondary | ICD-10-CM

## 2024-01-30 MED ORDER — AMPHETAMINE-DEXTROAMPHETAMINE 20 MG PO TABS: 20.0000 mg | ORAL_TABLET | Freq: Every day | ORAL | 0 refills | Status: DC | PRN

## 2024-01-30 MED ORDER — AMPHETAMINE-DEXTROAMPHET ER 30 MG PO CP24: 30.0000 mg | ORAL_CAPSULE | Freq: Two times a day (BID) | ORAL | 0 refills | Status: DC

## 2024-01-30 NOTE — Telephone Encounter (Signed)
 Copied from CRM [id number]. Topic: Clinical - Medication Refill >> Jan 30, 2024  8:02 AM Henry Maldonado wrote: Medication: amphetamine -dextroamphetamine  (ADDERALL XR) 30 MG 24 hr capsule [[id number]] amphetamine -dextroamphetamine  (ADDERALL) 20 MG tablet [[id number]]  Has the patient contacted their pharmacy? No (Agent: If no, request that the patient contact the pharmacy for the refill. If patient does not wish to contact the pharmacy document the reason why and proceed with request.) (Agent: If yes, when and what did the pharmacy advise?)  This is the patient's preferred pharmacy:    [id number] [place] [facility] - [address] [place] [address] Phone: [phone] Fax: [phone]  Is this the correct pharmacy for this prescription? Yes If no, delete pharmacy and type the correct one.   Has the prescription been filled recently? Yes  Is the patient out of the medication? No, two days left  Has the patient been seen for an appointment in the last year OR does the patient have an upcoming appointment? Yes  Can we respond through MyChart? Yes  Agent: Please be advised that Rx refills may take up to 3 business days. We ask that you follow-up with your pharmacy.

## 2024-02-27 ENCOUNTER — Other Ambulatory Visit: Payer: Self-pay | Admitting: Internal Medicine

## 2024-02-27 DIAGNOSIS — F900 Attention-deficit hyperactivity disorder, predominantly inattentive type: Secondary | ICD-10-CM

## 2024-02-27 NOTE — Telephone Encounter (Unsigned)
 Copied from CRM [id number]. Topic: Clinical - Medication Refill >> Feb 27, 2024  4:43 PM Winona R wrote: Medication: amphetamine -dextroamphetamine  (ADDERALL XR) 30 MG 24 hr capsule amphetamine -dextroamphetamine  (ADDERALL) 20 MG tablet    Has the patient contacted their pharmacy? No (Agent: If no, request that the patient contact the pharmacy for the refill. If patient does not wish to contact the pharmacy document the reason why and proceed with request.) (Agent: If yes, when and what did the pharmacy advise?)  This is the patient's preferred pharmacy:    [id number] - [address] [place] [address] Phone: [phone] Fax: [phone]  Is this the correct pharmacy for this prescription? Yes If no, delete pharmacy and type the correct one.   Has the prescription been filled recently? Yes  Is the patient out of the medication? Yes  Has the patient been seen for an appointment in the last year OR does the patient have an upcoming appointment? Yes  Can we respond through MyChart? Yes  Agent: Please be advised that Rx refills may take up to 3 business days. We ask that you follow-up with your pharmacy.

## 2024-02-28 MED ORDER — AMPHETAMINE-DEXTROAMPHETAMINE 20 MG PO TABS: 20.0000 mg | ORAL_TABLET | Freq: Every day | ORAL | 0 refills | Status: DC | PRN

## 2024-02-28 MED ORDER — AMPHETAMINE-DEXTROAMPHET ER 30 MG PO CP24: 30.0000 mg | ORAL_CAPSULE | Freq: Two times a day (BID) | ORAL | 0 refills | Status: DC

## 2024-03-27 ENCOUNTER — Other Ambulatory Visit: Payer: Self-pay | Admitting: Internal Medicine

## 2024-03-27 MED ORDER — AMPHETAMINE-DEXTROAMPHET ER 30 MG PO CP24: 30.0000 mg | ORAL_CAPSULE | Freq: Two times a day (BID) | ORAL | 0 refills | Status: DC

## 2024-03-27 NOTE — Telephone Encounter (Signed)
 Copied from CRM [id number]. Topic: Clinical - Medication Refill >> Mar 27, 2024 10:28 AM Lavanda D wrote: Medication: amphetamine -dextroamphetamine  (ADDERALL XR) 30 MG 24 hr capsule amphetamine -dextroamphetamine  (ADDERALL) 20 MG tablet  Has the patient contacted their pharmacy? No (Agent: If no, request that the patient contact the pharmacy for the refill. If patient does not wish to contact the pharmacy document the reason why and proceed with request.) (Agent: If yes, when and what did the pharmacy advise?)  This is the patient's preferred pharmacy:  [id number] - [address] [place] [address] Phone: [phone] Fax: [phone]  Is this the correct pharmacy for this prescription? Yes If no, delete pharmacy and type the correct one.   Has the prescription been filled recently? No  Is the patient out of the medication? No  Has the patient been seen for an appointment in the last year OR does the patient have an upcoming appointment? Yes  Can we respond through MyChart? Yes  Agent: Please be advised that Rx refills may take up to 3 business days. We ask that you follow-up with your pharmacy.

## 2024-03-31 ENCOUNTER — Other Ambulatory Visit: Payer: Self-pay | Admitting: Internal Medicine

## 2024-03-31 DIAGNOSIS — F900 Attention-deficit hyperactivity disorder, predominantly inattentive type: Secondary | ICD-10-CM

## 2024-03-31 MED ORDER — AMPHETAMINE-DEXTROAMPHETAMINE 20 MG PO TABS
20.0000 mg | ORAL_TABLET | Freq: Every day | ORAL | 0 refills | Status: DC | PRN
Start: 2024-03-31 — End: 2024-04-28

## 2024-03-31 NOTE — Telephone Encounter (Signed)
 Copied from CRM [id number]. Topic: Clinical - Medication Refill >> Mar 31, 2024  8:47 AM Zy'onna H wrote: Medication:  amphetamine -dextroamphetamine  (ADDERALL XR) 30 MG 24 hr capsule  amphetamine -dextroamphetamine  (ADDERALL) 20 MG tablet  Has the patient contacted their pharmacy? Yes (Agent: If no, request that the patient contact the pharmacy for the refill. If patient does not wish to contact the pharmacy document the reason why and proceed with request.) (Agent: If yes, when and what did the pharmacy advise?)  This is the patient's preferred pharmacy:  [id number] - [address] [place] [address] Phone: [phone] Fax: [phone]  Is this the correct pharmacy for this prescription? Yes If no, delete pharmacy and type the correct one.   Has the prescription been filled recently? Yes  Is the patient out of the medication? Yes  Has the patient been seen for an appointment in the last year OR does the patient have an upcoming appointment? Yes  Can we respond through MyChart? Yes *Patient has called in stating that the pharmacy has yet to receive an order for these Rx the patient is completely out of the medication and is requesting it be filled ASAP.    Agent: Please be advised that Rx refills may take up to 3 business days. We ask that you follow-up with your pharmacy.

## 2024-04-28 ENCOUNTER — Other Ambulatory Visit: Payer: Self-pay | Admitting: Internal Medicine

## 2024-04-28 DIAGNOSIS — F900 Attention-deficit hyperactivity disorder, predominantly inattentive type: Secondary | ICD-10-CM

## 2024-04-28 MED ORDER — AMPHETAMINE-DEXTROAMPHET ER 30 MG PO CP24: 30.0000 mg | ORAL_CAPSULE | Freq: Two times a day (BID) | ORAL | 0 refills | Status: DC

## 2024-04-28 MED ORDER — AMPHETAMINE-DEXTROAMPHETAMINE 20 MG PO TABS: 20.0000 mg | ORAL_TABLET | Freq: Every day | ORAL | 0 refills | Status: DC | PRN

## 2024-04-28 NOTE — Telephone Encounter (Signed)
 Copied from CRM [id number]. Topic: Clinical - Medication Refill >> Apr 28, 2024  1:01 PM Leah C wrote: Medication: amphetamine -dextroamphetamine  (ADDERALL XR) 30 MG 24 hr capsule; amphetamine -dextroamphetamine  (ADDERALL) 20 MG tablet  Has the patient contacted their pharmacy? Yes (Agent: If no, request that the patient contact the pharmacy for the refill. If patient does not wish to contact the pharmacy document the reason why and proceed with request.) (Agent: If yes, when and what did the pharmacy advise?)  This is the patient's preferred pharmacy:  [id number] [facility] - [address] [place] [address] Phone: [phone] Fax: [phone]  Is this the correct pharmacy for this prescription? Yes If no, delete pharmacy and type the correct one.   Has the prescription been filled recently? Yes  Is the patient out of the medication? Yes, and patient will be leaving Thursday morning and would like to have script by then.   Has the patient been seen for an appointment in the last year OR does the patient have an upcoming appointment? Yes  Can we respond through MyChart? yes  Agent: Please be advised that Rx refills may take up to 3 business days. We ask that you follow-up with your pharmacy.

## 2024-05-26 ENCOUNTER — Other Ambulatory Visit: Payer: Self-pay | Admitting: Internal Medicine

## 2024-05-26 DIAGNOSIS — F900 Attention-deficit hyperactivity disorder, predominantly inattentive type: Secondary | ICD-10-CM

## 2024-05-26 MED ORDER — AMPHETAMINE-DEXTROAMPHET ER 30 MG PO CP24: 30.0000 mg | ORAL_CAPSULE | Freq: Two times a day (BID) | ORAL | 0 refills | Status: DC

## 2024-05-26 MED ORDER — AMPHETAMINE-DEXTROAMPHETAMINE 20 MG PO TABS: 20.0000 mg | ORAL_TABLET | Freq: Every day | ORAL | 0 refills | Status: DC | PRN

## 2024-05-26 NOTE — Telephone Encounter (Signed)
 Copied from CRM #[id number]. Topic: Clinical - Medication Refill >> May 26, 2024  8:04 AM Tanazia G wrote: Medication:  amphetamine -dextroamphetamine  (ADDERALL XR) 30 MG 24 hr capsule  amphetamine -dextroamphetamine  (ADDERALL) 20 MG tablet   Has the patient contacted their pharmacy? Yes (Agent: If no, request that the patient contact the pharmacy for the refill. If patient does not wish to contact the pharmacy document the reason why and proceed with request.) (Agent: If yes, when and what did the pharmacy advise?)  This is the patient's preferred pharmacy:  [id number] - [address] [place] [address] Phone: [phone] Fax: [phone]  Is this the correct pharmacy for this prescription? Yes If no, delete pharmacy and type the correct one.   Has the prescription been filled recently? Yes  Is the patient out of the medication? No  Has the patient been seen for an appointment in the last year OR does the patient have an upcoming appointment? Yes  Can we respond through MyChart? Yes  Agent: Please be advised that Rx refills may take up to 3 business days. We ask that you follow-up with your pharmacy.

## 2024-06-24 ENCOUNTER — Other Ambulatory Visit: Payer: Self-pay | Admitting: Internal Medicine

## 2024-06-24 DIAGNOSIS — F900 Attention-deficit hyperactivity disorder, predominantly inattentive type: Secondary | ICD-10-CM

## 2024-06-24 MED ORDER — AMPHETAMINE-DEXTROAMPHETAMINE 20 MG PO TABS
20.0000 mg | ORAL_TABLET | Freq: Every day | ORAL | 0 refills | Status: AC | PRN
Start: 2024-06-24 — End: ?

## 2024-06-24 MED ORDER — AMPHETAMINE-DEXTROAMPHET ER 30 MG PO CP24: 30.0000 mg | ORAL_CAPSULE | Freq: Two times a day (BID) | ORAL | 0 refills | Status: DC

## 2024-06-24 NOTE — Telephone Encounter (Signed)
 Copied from CRM #[id number]. Topic: Clinical - Medication Refill >> Jun 24, 2024  9:01 AM Zy'onna H wrote: Medication:  amphetamine -dextroamphetamine  (ADDERALL) 20 MG tablet amphetamine -dextroamphetamine  (ADDERALL XR) 30 MG 24 hr capsule  Has the patient contacted their pharmacy? Yes (Agent: If no, request that the patient contact the pharmacy for the refill. If patient does not wish to contact the pharmacy document the reason why and proceed with request.) (Agent: If yes, when and what did the pharmacy advise?)  This is the patient's preferred pharmacy:  [id number] [facility] - [address] [place] [address] Phone: [phone] Fax: [phone]  Is this the correct pharmacy for this prescription? Yes If no, delete pharmacy and type the correct one.   Has the prescription been filled recently? Yes  Is the patient out of the medication? No  Has the patient been seen for an appointment in the last year OR does the patient have an upcoming appointment? Yes  Can we respond through MyChart? Yes  Agent: Please be advised that Rx refills may take up to 3 business days. We ask that you follow-up with your pharmacy.

## 2024-07-23 ENCOUNTER — Other Ambulatory Visit: Payer: Self-pay | Admitting: Internal Medicine

## 2024-07-23 DIAGNOSIS — F900 Attention-deficit hyperactivity disorder, predominantly inattentive type: Secondary | ICD-10-CM

## 2024-07-23 NOTE — Telephone Encounter (Signed)
 Copied from CRM #[id number]. Topic: Clinical - Medication Refill >> Jul 23, 2024 10:55 AM Avram MATSU wrote: Medication: amphetamine -dextroamphetamine  (ADDERALL XR) 30 MG 24 hr capsule [[id number]] amphetamine -dextroamphetamine  (ADDERALL) 20 MG tablet [[id number]]  Has the patient contacted their pharmacy? No (Agent: If no, request that the patient contact the pharmacy for the refill. If patient does not wish to contact the pharmacy document the reason why and proceed with request.) (Agent: If yes, when and what did the pharmacy advise?)  This is the patient's preferred pharmacy:  [id number] - [address] [place] [address] Phone: [phone] Fax: [phone]  Is this the correct pharmacy for this prescription? Yes If no, delete pharmacy and type the correct one.   Has the prescription been filled recently? No  Is the patient out of the medication? No  Has the patient been seen for an appointment in the last year OR does the patient have an upcoming appointment? Yes  Can we respond through MyChart? Yes  Agent: Please be advised that Rx refills may take up to 3 business days. We ask that you follow-up with your pharmacy.

## 2024-07-24 MED ORDER — AMPHETAMINE-DEXTROAMPHETAMINE 20 MG PO TABS: 20.0000 mg | ORAL_TABLET | Freq: Every day | ORAL | 0 refills | Status: DC | PRN

## 2024-07-24 MED ORDER — AMPHETAMINE-DEXTROAMPHET ER 30 MG PO CP24: 30.0000 mg | ORAL_CAPSULE | Freq: Two times a day (BID) | ORAL | 0 refills | Status: DC

## 2024-08-20 ENCOUNTER — Other Ambulatory Visit: Payer: Self-pay | Admitting: Internal Medicine

## 2024-08-20 DIAGNOSIS — F900 Attention-deficit hyperactivity disorder, predominantly inattentive type: Secondary | ICD-10-CM

## 2024-08-20 NOTE — Telephone Encounter (Signed)
 Copied from CRM [id number]. Topic: Clinical - Medication Refill >> Aug 20, 2024  3:01 PM Chasity T wrote: Medication: amphetamine -dextroamphetamine  (ADDERALL XR) 30 MG 24 hr capsule amphetamine -dextroamphetamine  (ADDERALL) 20 MG tablet  Has the patient contacted their pharmacy? Yes  This is the patient's preferred pharmacy:  [id number] [facility] - [address] [place] [address] Phone: [phone] Fax: [phone]  Is this the correct pharmacy for this prescription? Yes If no, delete pharmacy and type the correct one.   Has the prescription been filled recently? Yes  Is the patient out of the medication? No  Has the patient been seen for an appointment in the last year OR does the patient have an upcoming appointment? Yes  Can we respond through MyChart? Yes  Agent: Please be advised that Rx refills may take up to 3 business days. We ask that you follow-up with your pharmacy.

## 2024-08-21 MED ORDER — AMPHETAMINE-DEXTROAMPHETAMINE 20 MG PO TABS: 20.0000 mg | ORAL_TABLET | Freq: Every day | ORAL | 0 refills | Status: AC | PRN

## 2024-08-21 MED ORDER — AMPHETAMINE-DEXTROAMPHET ER 30 MG PO CP24: 30.0000 mg | ORAL_CAPSULE | Freq: Two times a day (BID) | ORAL | 0 refills | Status: AC
